# Patient Record
Sex: Female | Born: 1990 | Race: Black or African American | Hispanic: No | Marital: Single | Smoking: Current some day smoker
Health system: Southern US, Community
[De-identification: ages and names within clinical notes are randomized; demographics above are authoritative.]

## PROBLEM LIST (undated history)

## (undated) DIAGNOSIS — E119 Type 2 diabetes mellitus without complications: Secondary | ICD-10-CM

## (undated) DIAGNOSIS — Z89619 Acquired absence of unspecified leg above knee: Secondary | ICD-10-CM

## (undated) DIAGNOSIS — I10 Essential (primary) hypertension: Secondary | ICD-10-CM

## (undated) DIAGNOSIS — Z8619 Personal history of other infectious and parasitic diseases: Secondary | ICD-10-CM

## (undated) HISTORY — DX: Type 2 diabetes mellitus without complications: E11.9

## (undated) HISTORY — DX: Personal history of other infectious and parasitic diseases: Z86.19

## (undated) HISTORY — DX: Acquired absence of unspecified leg above knee: Z89.619

## (undated) HISTORY — PX: HIP FUSION: SHX669

---
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
## 2008-01-18 DIAGNOSIS — E66813 Obesity, class 3: Secondary | ICD-10-CM | POA: Insufficient documentation

## 2008-04-26 ENCOUNTER — Emergency Department: Payer: Self-pay | Admitting: Internal Medicine

## 2011-03-05 ENCOUNTER — Emergency Department: Payer: Self-pay | Admitting: Unknown Physician Specialty

## 2011-07-08 DIAGNOSIS — Z0000 Encounter for general adult medical examination without abnormal findings: Secondary | ICD-10-CM | POA: Diagnosis not present

## 2011-11-14 DIAGNOSIS — J069 Acute upper respiratory infection, unspecified: Secondary | ICD-10-CM | POA: Diagnosis not present

## 2012-10-19 DIAGNOSIS — S78119A Complete traumatic amputation at level between unspecified hip and knee, initial encounter: Secondary | ICD-10-CM | POA: Diagnosis not present

## 2012-10-19 DIAGNOSIS — R05 Cough: Secondary | ICD-10-CM | POA: Diagnosis not present

## 2012-11-29 DIAGNOSIS — Z029 Encounter for administrative examinations, unspecified: Secondary | ICD-10-CM | POA: Diagnosis not present

## 2012-11-29 DIAGNOSIS — E119 Type 2 diabetes mellitus without complications: Secondary | ICD-10-CM | POA: Diagnosis not present

## 2012-12-17 ENCOUNTER — Ambulatory Visit: Payer: Self-pay | Admitting: Hematology and Oncology

## 2012-12-25 DIAGNOSIS — IMO0002 Reserved for concepts with insufficient information to code with codable children: Secondary | ICD-10-CM | POA: Diagnosis not present

## 2012-12-25 DIAGNOSIS — E119 Type 2 diabetes mellitus without complications: Secondary | ICD-10-CM | POA: Diagnosis not present

## 2013-08-04 DIAGNOSIS — R7309 Other abnormal glucose: Secondary | ICD-10-CM | POA: Diagnosis not present

## 2013-08-04 DIAGNOSIS — J209 Acute bronchitis, unspecified: Secondary | ICD-10-CM | POA: Diagnosis not present

## 2013-08-04 LAB — HEMOGLOBIN A1C: Hemoglobin A1C: 6.7

## 2013-09-12 DIAGNOSIS — J069 Acute upper respiratory infection, unspecified: Secondary | ICD-10-CM | POA: Diagnosis not present

## 2013-09-12 DIAGNOSIS — R7309 Other abnormal glucose: Secondary | ICD-10-CM | POA: Diagnosis not present

## 2013-09-23 ENCOUNTER — Emergency Department: Payer: Self-pay | Admitting: Emergency Medicine

## 2013-09-23 DIAGNOSIS — E119 Type 2 diabetes mellitus without complications: Secondary | ICD-10-CM | POA: Diagnosis not present

## 2013-09-23 DIAGNOSIS — M47817 Spondylosis without myelopathy or radiculopathy, lumbosacral region: Secondary | ICD-10-CM | POA: Diagnosis not present

## 2013-09-23 DIAGNOSIS — S339XXA Sprain of unspecified parts of lumbar spine and pelvis, initial encounter: Secondary | ICD-10-CM | POA: Diagnosis not present

## 2013-09-23 DIAGNOSIS — Z79899 Other long term (current) drug therapy: Secondary | ICD-10-CM | POA: Diagnosis not present

## 2014-04-01 ENCOUNTER — Emergency Department: Payer: Self-pay | Admitting: Emergency Medicine

## 2014-04-01 DIAGNOSIS — E119 Type 2 diabetes mellitus without complications: Secondary | ICD-10-CM | POA: Diagnosis not present

## 2014-04-01 DIAGNOSIS — S39012A Strain of muscle, fascia and tendon of lower back, initial encounter: Secondary | ICD-10-CM | POA: Diagnosis not present

## 2014-04-01 DIAGNOSIS — M545 Low back pain: Secondary | ICD-10-CM | POA: Diagnosis not present

## 2014-04-01 DIAGNOSIS — R319 Hematuria, unspecified: Secondary | ICD-10-CM | POA: Diagnosis not present

## 2014-04-01 DIAGNOSIS — Z720 Tobacco use: Secondary | ICD-10-CM | POA: Diagnosis not present

## 2014-04-01 LAB — CBC WITH DIFFERENTIAL/PLATELET
Basophil #: 0.1 10*3/uL (ref 0.0–0.1)
Basophil %: 0.7 %
EOS PCT: 1.6 %
Eosinophil #: 0.2 10*3/uL (ref 0.0–0.7)
HCT: 36.1 % (ref 35.0–47.0)
HGB: 11.2 g/dL — AB (ref 12.0–16.0)
LYMPHS PCT: 29.2 %
Lymphocyte #: 3 10*3/uL (ref 1.0–3.6)
MCH: 23 pg — ABNORMAL LOW (ref 26.0–34.0)
MCHC: 31 g/dL — ABNORMAL LOW (ref 32.0–36.0)
MCV: 74 fL — AB (ref 80–100)
Monocyte #: 0.7 x10 3/mm (ref 0.2–0.9)
Monocyte %: 6.8 %
Neutrophil #: 6.4 10*3/uL (ref 1.4–6.5)
Neutrophil %: 61.7 %
Platelet: 380 10*3/uL (ref 150–440)
RBC: 4.85 10*6/uL (ref 3.80–5.20)
RDW: 18.4 % — AB (ref 11.5–14.5)
WBC: 10.4 10*3/uL (ref 3.6–11.0)

## 2014-04-01 LAB — COMPREHENSIVE METABOLIC PANEL
ALT: 16 U/L
ANION GAP: 9 (ref 7–16)
Albumin: 3.6 g/dL (ref 3.4–5.0)
Alkaline Phosphatase: 80 U/L
BUN: 11 mg/dL (ref 7–18)
Bilirubin,Total: 0.4 mg/dL (ref 0.2–1.0)
CHLORIDE: 106 mmol/L (ref 98–107)
Calcium, Total: 8.4 mg/dL — ABNORMAL LOW (ref 8.5–10.1)
Co2: 23 mmol/L (ref 21–32)
Creatinine: 0.74 mg/dL (ref 0.60–1.30)
EGFR (African American): >60
EGFR (Non-African Amer.): >60
Glucose: 117 mg/dL — ABNORMAL HIGH (ref 65–99)
OSMOLALITY: 276 (ref 275–301)
POTASSIUM: 3.6 mmol/L (ref 3.5–5.1)
SGOT(AST): 16 U/L (ref 15–37)
Sodium: 138 mmol/L (ref 136–145)
TOTAL PROTEIN: 7.5 g/dL (ref 6.4–8.2)

## 2014-04-01 LAB — URINALYSIS, COMPLETE
Bilirubin,UR: NEGATIVE
Glucose,UR: NEGATIVE mg/dL (ref 0–75)
Ketone: NEGATIVE
Nitrite: NEGATIVE
PH: 6 (ref 4.5–8.0)
Protein: NEGATIVE
RBC,UR: 6 /HPF (ref 0–5)
SPECIFIC GRAVITY: 1.031 (ref 1.003–1.030)

## 2014-04-01 LAB — PREGNANCY, URINE: PREGNANCY TEST, URINE: NEGATIVE m[IU]/mL

## 2015-06-04 DIAGNOSIS — E119 Type 2 diabetes mellitus without complications: Secondary | ICD-10-CM | POA: Insufficient documentation

## 2015-06-05 ENCOUNTER — Encounter: Payer: Self-pay | Admitting: Family Medicine

## 2015-06-11 ENCOUNTER — Ambulatory Visit (INDEPENDENT_AMBULATORY_CARE_PROVIDER_SITE_OTHER): Payer: Medicare Other | Admitting: Family Medicine

## 2015-06-11 ENCOUNTER — Encounter: Payer: Self-pay | Admitting: Family Medicine

## 2015-06-11 VITALS — BP 150/100 | HR 95 | Temp 98.7°F | Resp 16 | Wt 296.0 lb

## 2015-06-11 DIAGNOSIS — J0110 Acute frontal sinusitis, unspecified: Secondary | ICD-10-CM

## 2015-06-11 DIAGNOSIS — R509 Fever, unspecified: Secondary | ICD-10-CM

## 2015-06-11 DIAGNOSIS — G4700 Insomnia, unspecified: Secondary | ICD-10-CM | POA: Diagnosis not present

## 2015-06-11 DIAGNOSIS — Z89612 Acquired absence of left leg above knee: Secondary | ICD-10-CM | POA: Diagnosis not present

## 2015-06-11 LAB — POCT INFLUENZA A/B
Influenza A, POC: NEGATIVE
Influenza B, POC: NEGATIVE

## 2015-06-11 MED ORDER — AMOXICILLIN 500 MG PO CAPS: 1000.0000 mg | ORAL_CAPSULE | Freq: Two times a day (BID) | ORAL | Status: AC

## 2015-06-11 MED ORDER — FLUTICASONE PROPIONATE 50 MCG/ACT NA SUSP: 2.0000 | Freq: Every day | NASAL | Status: DC

## 2015-06-11 NOTE — Patient Instructions (Signed)
Try OTC melatonin  at bedtime every night to help sleep

## 2015-06-11 NOTE — Progress Notes (Signed)
       Patient: Casey Reese Female    DOB: [birth year]   24 y.o.   MRN: [id number] Visit Date: 06/11/2015  Today's Provider: Mila Merry, MD   Chief Complaint  Patient presents with  . Sinusitis   Subjective:    Sinusitis This is a new problem. The current episode started yesterday. The problem has been gradually worsening since onset. The maximum temperature recorded prior to her arrival was 101 - 101.9 F. The fever has been present for 1 to 2 days. Associated symptoms include chills, congestion, coughing, headaches, neck pain, shortness of breath, sinus pressure, sneezing, a sore throat and swollen glands. Pertinent negatives include no diaphoresis, ear pain or hoarse voice. Past treatments include nothing.   Headache and sinus burning started yesterday morning. Rapid onset fever last night 102-100. She also complains of having difficulty sleeping at night. Has tried Unisom which made her dizzy.   Allergies  Allergen Reactions  . Sulfa Antibiotics    Previous Medications   ALBUTEROL (PROVENTIL HFA) 108 (90 BASE) MCG/ACT INHALER    Inhale 2 puffs into the lungs every 6 (six) hours as needed.    Review of Systems  Constitutional: Positive for chills. Negative for fever, diaphoresis, appetite change and fatigue.  HENT: Positive for congestion, sinus pressure, sneezing and sore throat. Negative for ear pain and hoarse voice.   Respiratory: Positive for cough and shortness of breath. Negative for chest tightness.   Cardiovascular: Negative for chest pain and palpitations.  Gastrointestinal: Negative for nausea, vomiting and abdominal pain.  Musculoskeletal: Positive for neck pain.  Neurological: Positive for headaches. Negative for dizziness and weakness.    Social History  Substance Use Topics  . Smoking status: Current Every Day Smoker    Types: Cigarettes  . Smokeless tobacco: Not on file  . Alcohol Use: No   Objective:   BP 150/100 mmHg  Pulse 95  Temp(Src) 98.7  F (37.1 C) (Oral)  Resp 16  Wt 296 lb (134.265 kg)  SpO2 99%  LMP 05/18/2015  Physical Exam  General Appearance:    Alert, cooperative, no distress  HENT:   bilateral TM normal without fluid or infection, neck without nodes, Frontal sinuses tender and nasal mucosa congested  Eyes:    PERRL, conjunctiva/corneas clear, EOM's intact       Lungs:     Clear to auscultation bilaterally, respirations unlabored  Heart:    Regular rate and rhythm  MS:    S/p left AKA      Results for orders placed or performed in visit on 06/11/15  POCT Influenza A/B  Result Value Ref Range   Influenza A, POC Negative Negative   Influenza B, POC Negative Negative       Assessment & Plan:     1. Acute frontal sinusitis, recurrence not specified Amoxicillin  BID for 10 days Call if symptoms change or if not rapidly improving.    2. Fever, unspecified fever cause  - POCT Influenza A/B  3. Insomnia Patient Instructions  Try OTC melatonin  at bedtime every night to help sleep          Mila Merry, MD  [facility]

## 2015-06-13 ENCOUNTER — Emergency Department
Admission: EM | Admit: 2015-06-13 | Discharge: 2015-06-13 | Disposition: A | Discharge status: Home / Self Care | Payer: Medicare Other | Attending: Emergency Medicine | Admitting: Emergency Medicine

## 2015-06-13 ENCOUNTER — Encounter: Payer: Self-pay | Admitting: Emergency Medicine

## 2015-06-13 DIAGNOSIS — Z7951 Long term (current) use of inhaled steroids: Secondary | ICD-10-CM | POA: Diagnosis not present

## 2015-06-13 DIAGNOSIS — R319 Hematuria, unspecified: Secondary | ICD-10-CM

## 2015-06-13 DIAGNOSIS — J069 Acute upper respiratory infection, unspecified: Secondary | ICD-10-CM | POA: Insufficient documentation

## 2015-06-13 DIAGNOSIS — Z3202 Encounter for pregnancy test, result negative: Secondary | ICD-10-CM | POA: Insufficient documentation

## 2015-06-13 DIAGNOSIS — E119 Type 2 diabetes mellitus without complications: Secondary | ICD-10-CM | POA: Insufficient documentation

## 2015-06-13 DIAGNOSIS — F17210 Nicotine dependence, cigarettes, uncomplicated: Secondary | ICD-10-CM | POA: Insufficient documentation

## 2015-06-13 DIAGNOSIS — N390 Urinary tract infection, site not specified: Secondary | ICD-10-CM | POA: Insufficient documentation

## 2015-06-13 DIAGNOSIS — R300 Dysuria: Secondary | ICD-10-CM | POA: Diagnosis present

## 2015-06-13 DIAGNOSIS — Z792 Long term (current) use of antibiotics: Secondary | ICD-10-CM | POA: Diagnosis not present

## 2015-06-13 LAB — URINALYSIS COMPLETE WITH MICROSCOPIC (ARMC ONLY)
BILIRUBIN URINE: NEGATIVE
Bacteria, UA: NONE SEEN
Glucose, UA: NEGATIVE mg/dL
KETONES UR: NEGATIVE mg/dL
Nitrite: NEGATIVE
PROTEIN: NEGATIVE mg/dL
SPECIFIC GRAVITY, URINE: 1.021 (ref 1.005–1.030)
pH: 5 (ref 5.0–8.0)

## 2015-06-13 LAB — POCT PREGNANCY, URINE: Preg Test, Ur: NEGATIVE

## 2015-06-13 MED ORDER — CIPROFLOXACIN HCL 500 MG PO TABS: 500.0000 mg | ORAL_TABLET | Freq: Two times a day (BID) | ORAL | Status: AC

## 2015-06-13 NOTE — ED Provider Notes (Signed)
[facility] Emergency Department Provider Note  ____________________________________________  Time seen: Approximately 12:26 PM  I have reviewed the triage vital signs and the nursing notes.   HISTORY  Chief Complaint Dysuria    HPI Casey Reese is a 25 y.o. female , NAD, presents to the emergency department with acute onset burning with urination this morning. Denies abdominal or back pain. Has not seen any blood in her urine. No fevers, chills, body aches. Also notes she has had upper respiratory symptoms including nonproductive cough, sinus congestion, nasal drainage over the last couple days.   Past Medical History  Diagnosis Date  . S/P above knee amputation (HCC)   . History of chicken pox   . Diabetes mellitus without complication (HCC)   . 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 Diabetes mellitus (HCC) 06/04/2015  . Morbid obesity (HCC) 01/18/2008  . 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 Above knee leg amputation Left 2003    Current Outpatient Rx  Name  Route  Sig  Dispense  Refill  . albuterol (PROVENTIL HFA) 108 (90 Base) MCG/ACT inhaler   Inhalation   Inhale 2 puffs into the lungs every 6 (six) hours as needed.         Marland Kitchen amoxicillin (AMOXIL) 500 MG capsule   Oral   Take 2 capsules (1,000 mg total) by mouth 2 (two) times daily.   40 capsule   0   . ciprofloxacin (CIPRO) 500 MG tablet   Oral   Take 1 tablet (500 mg total) by mouth 2 (two) times daily.   14 tablet   0   . fluticasone (FLONASE) 50 MCG/ACT nasal spray   Each Nare   Place 2 sprays into both nostrils daily.   16 g   1     Allergies Sulfa antibiotics  Family History  Problem Relation Age of Onset  . Diabetes Mother     type 2    Social History Social History  Substance Use Topics  . Smoking status: Current  Every Day Smoker    Types: Cigarettes  . Smokeless tobacco: None  . Alcohol Use: No     Review of Systems  Constitutional: No fever/chills, fatigue. Eyes: No visual changes. No discharge ENT: Positive nasal/sinus congestion, runny nose. No sore throat, ear pain. Cardiovascular: No chest pain. Respiratory: Positive non productive cough. No shortness of breath. No wheezing.  Gastrointestinal: No abdominal pain.  No nausea, vomiting.  Genitourinary: Positive for dysuria. No hematuria. No urinary hesitancy, urgency or increased frequency. Musculoskeletal: Negative for back pain.  Skin: Negative for rash, skin sores. Neurological: Negative for headaches, focal weakness or numbness. 10-point ROS otherwise negative.  ____________________________________________   PHYSICAL EXAM:  VITAL SIGNS: ED Triage Vitals  Enc Vitals Group     BP 06/13/15 1149 181/102 mmHg     Pulse Rate 06/13/15 1149 99     Resp --      Temp 06/13/15 1149 98.5 F (36.9 C)     Temp Source 06/13/15 1149 Oral     SpO2 06/13/15 1149 99 %     Weight 06/13/15 1149 257 lb (116.574 kg)     Height 06/13/15 1149  (1.676 m)     Head Cir --      Peak Flow --      Pain Score --  Pain Loc --      Pain Edu? --      Excl. in GC? --     Constitutional: Alert and oriented. Well appearing and in no acute distress. Eyes: Conjunctivae are normal. PERRL. EOMI without pain.  Head: Atraumatic. ENT:      Ears: Bilateral TMs visualized with trace serous effusions but no erythema or bulging. Light reflex is normal. Bilateral external ear canals without swelling, erythema, drainage.      Nose: Moderate congestion with clear rhinnorhea.      Mouth/Throat: Mucous membranes are moist. Pharynx without erythema, swelling, exudate. Neck: Supple with full range of motion Hematological/Lymphatic/Immunilogical: No cervical lymphadenopathy. Cardiovascular: Normal rate, regular rhythm. Normal S1 and S2.   Respiratory: Normal  respiratory effort without tachypnea or retractions. Lungs CTAB. Gastrointestinal: Soft and nontender. No distention. No CVA tenderness. Neurologic:  Normal speech and language. No gross focal neurologic deficits are appreciated.  Skin:  Skin is warm, dry and intact. No rash noted. Psychiatric: Mood and affect are normal. Speech and behavior are normal. Patient exhibits appropriate insight and judgement.   ____________________________________________   LABS (all labs ordered are listed, but only abnormal results are displayed)  Labs Reviewed  URINALYSIS COMPLETEWITH MICROSCOPIC ([facility] ONLY) - Abnormal; Notable for the following:    Color, Urine YELLOW (*)    APPearance HAZY (*)    Hgb urine dipstick 2+ (*)    Leukocytes, UA 1+ (*)    Squamous Epithelial / LPF 6-30 (*)    All other components within normal limits  URINE CULTURE  POC URINE PREG, ED  POCT PREGNANCY, URINE   ____________________________________________  EKG  None ____________________________________________  RADIOLOGY  None ____________________________________________    PROCEDURES  Procedure(s) performed: None   Medications - No data to display   ____________________________________________   INITIAL IMPRESSION / ASSESSMENT AND PLAN / ED COURSE  Pertinent lab results that were available during my care of the patient were reviewed by me and considered in my medical decision making (see chart for details).  Patient's diagnosis is consistent with urinary tract infection and viral upper respiratory infection. Patient will be discharged home with prescriptions for ciprofloxacin 500 mg take one tablet by mouth twice daily for 10 days.  Patient is able to take over-the-counter cough and cold medications as needed for upper respiratory symptoms. Patient is to follow up with her primary care provider if symptoms persist past this treatment course. Patient is given ED precautions to return to the ED for any  worsening or new symptoms.    ____________________________________________  FINAL CLINICAL IMPRESSION(S) / ED DIAGNOSES  Final diagnoses:  Urinary tract infection with hematuria, site unspecified  Upper respiratory infection      NEW MEDICATIONS STARTED DURING THIS VISIT:  Discharge Medication List as of 06/13/2015 12:52 PM    START taking these medications   Details  ciprofloxacin (CIPRO) 500 MG tablet Take 1 tablet (500 mg total) by mouth 2 (two) times daily., Starting 06/13/2015, Until Wed 06/20/15, Print             Ernestene Kiel Taylor, PA-C 06/13/15 1336  Rockne Menghini, MD 06/13/15 1541

## 2015-06-13 NOTE — Discharge Instructions (Signed)
Urinary Tract Infection °Urinary tract infections (UTIs) can develop anywhere along your urinary tract. Your urinary tract is your body's drainage system for removing wastes and extra water. Your urinary tract includes two kidneys, two ureters, a bladder, and a urethra. Your kidneys are a pair of bean-shaped organs. Each kidney is about the size of your fist. They are located below your ribs, one on each side of your spine. °CAUSES °Infections are caused by microbes, which are microscopic organisms, including fungi, viruses, and bacteria. These organisms are so small that they can only be seen through a microscope. Bacteria are the microbes that most commonly cause UTIs. °SYMPTOMS  °Symptoms of UTIs may vary by age and gender of the patient and by the location of the infection. Symptoms in young women typically include a frequent and intense urge to urinate and a painful, burning feeling in the bladder or urethra during urination. Older women and men are more likely to be tired, shaky, and weak and have muscle aches and abdominal pain. A fever may mean the infection is in your kidneys. Other symptoms of a kidney infection include pain in your back or sides below the ribs, nausea, and vomiting. °DIAGNOSIS °To diagnose a UTI, your caregiver will ask you about your symptoms. Your caregiver will also ask you to provide a urine sample. The urine sample will be tested for bacteria and white blood cells. White blood cells are made by your body to help fight infection. °TREATMENT  °Typically, UTIs can be treated with medication. Because most UTIs are caused by a bacterial infection, they usually can be treated with the use of antibiotics. The choice of antibiotic and length of treatment depend on your symptoms and the type of bacteria causing your infection. °HOME CARE INSTRUCTIONS °· If you were prescribed antibiotics, take them exactly as your caregiver instructs you. Finish the medication even if you feel better after  you have only taken some of the medication. °· Drink enough water and fluids to keep your urine clear or pale yellow. °· Avoid caffeine, tea, and carbonated beverages. They tend to irritate your bladder. °· Empty your bladder often. Avoid holding urine for long periods of time. °· Empty your bladder before and after sexual intercourse. °· After a bowel movement, women should cleanse from front to back. Use each tissue only once. °SEEK MEDICAL CARE IF:  °· You have back pain. °· You develop a fever. °· Your symptoms do not begin to resolve within 3 days. °SEEK IMMEDIATE MEDICAL CARE IF:  °· You have severe back pain or lower abdominal pain. °· You develop chills. °· You have nausea or vomiting. °· You have continued burning or discomfort with urination. °MAKE SURE YOU:  °· Understand these instructions. °· Will watch your condition. °· Will get help right away if you are not doing well or get worse. °  °This information is not intended to replace advice given to you by your health care provider. Make sure you discuss any questions you have with your health care provider. °  °Document Released: 01/22/2005 Document Revised: 01/03/2015 Document Reviewed: 05/23/2011 °Elsevier Interactive Patient Education ©2016 [employer] °Upper Respiratory Infection, Adult °Most upper respiratory infections (URIs) are a viral infection of the air passages leading to the lungs. A URI affects the nose, throat, and upper air passages. The most common type of URI is nasopharyngitis and is typically referred to as "the common cold." °URIs run their course and usually go away on their own. Most of   the time, a URI does not require medical attention, but sometimes a bacterial infection in the upper airways can follow a viral infection. This is called a secondary infection. Sinus and middle ear infections are common types of secondary upper respiratory infections. °Bacterial pneumonia can also complicate a URI. A URI can worsen asthma and  chronic obstructive pulmonary disease (COPD). Sometimes, these complications can require emergency medical care and may be life threatening.  °CAUSES °Almost all URIs are caused by viruses. A virus is a type of germ and can spread from one person to another.  °RISKS FACTORS °You may be at risk for a URI if:  °· You smoke.   °· You have chronic heart or lung disease. °· You have a weakened defense (immune) system.   °· You are very young or very old.   °· You have nasal allergies or asthma. °· You work in crowded or poorly ventilated areas. °· You work in health care facilities or schools. °SIGNS AND SYMPTOMS  °Symptoms typically develop 2-3 days after you come in contact with a cold virus. Most viral URIs last 7-10 days. However, viral URIs from the influenza virus (flu virus) can last 14-18 days and are typically more severe. Symptoms may include:  °· Runny or stuffy (congested) nose.   °· Sneezing.   °· Cough.   °· Sore throat.   °· Headache.   °· Fatigue.   °· Fever.   °· Loss of appetite.   °· Pain in your forehead, behind your eyes, and over your cheekbones (sinus pain). °· Muscle aches.   °DIAGNOSIS  °Your health care provider may diagnose a URI by: °· Physical exam. °· Tests to check that your symptoms are not due to another condition such as: °· Strep throat. °· Sinusitis. °· Pneumonia. °· Asthma. °TREATMENT  °A URI goes away on its own with time. It cannot be cured with medicines, but medicines may be prescribed or recommended to relieve symptoms. Medicines may help: °· Reduce your fever. °· Reduce your cough. °· Relieve nasal congestion. °HOME CARE INSTRUCTIONS  °· Take medicines only as directed by your health care provider.   °· Gargle warm saltwater or take cough drops to comfort your throat as directed by your health care provider. °· Use a warm mist humidifier or inhale steam from a shower to increase air moisture. This may make it easier to breathe. °· Drink enough fluid to keep your urine clear or  pale yellow.   °· Eat soups and other clear broths and maintain good nutrition.   °· Rest as needed.   °· Return to work when your temperature has returned to normal or as your health care provider advises. You may need to stay home longer to avoid infecting others. You can also use a face mask and careful hand washing to prevent spread of the virus. °· Increase the usage of your inhaler if you have asthma.   °· Do not use any tobacco products, including cigarettes, chewing tobacco, or electronic cigarettes. If you need help quitting, ask your health care provider. °PREVENTION  °The best way to protect yourself from getting a cold is to practice good hygiene.  °· Avoid oral or hand contact with people with cold symptoms.   °· Wash your hands often if contact occurs.   °There is no clear evidence that vitamin C, vitamin E, echinacea, or exercise reduces the chance of developing a cold. However, it is always recommended to get plenty of rest, exercise, and practice good nutrition.  °SEEK MEDICAL CARE IF:  °· You are getting worse rather than better.   °·   Your symptoms are not controlled by medicine.   °· You have chills. °· You have worsening shortness of breath. °· You have brown or red mucus. °· You have yellow or brown nasal discharge. °· You have pain in your face, especially when you bend forward. °· You have a fever. °· You have swollen neck glands. °· You have pain while swallowing. °· You have white areas in the back of your throat. °SEEK IMMEDIATE MEDICAL CARE IF:  °· You have severe or persistent: °¨ Headache. °¨ Ear pain. °¨ Sinus pain. °¨ Chest pain. °· You have chronic lung disease and any of the following: °¨ Wheezing. °¨ Prolonged cough. °¨ Coughing up blood. °¨ A change in your usual mucus. °· You have a stiff neck. °· You have changes in your: °¨ Vision. °¨ Hearing. °¨ Thinking. °¨ Mood. °MAKE SURE YOU:  °· Understand these instructions. °· Will watch your condition. °· Will get help right away if you  are not doing well or get worse. °  °This information is not intended to replace advice given to you by your health care provider. Make sure you discuss any questions you have with your health care provider. °  °Document Released: 10/08/2000 Document Revised: 08/29/2014 Document Reviewed: 07/20/2013 °Elsevier Interactive Patient Education ©2016 Elsevier Inc. ° °

## 2015-06-13 NOTE — ED Notes (Signed)
A/o, vss, HTN. Lungs clear. Non-productive cough. Sinus congestion. No c/o pain.

## 2015-06-13 NOTE — ED Notes (Signed)
Pt aware of need for urine sample. Has container.

## 2015-06-13 NOTE — ED Notes (Signed)
Pt with burning when urinating. Also with upper respiratory symptoms.

## 2015-06-14 LAB — URINE CULTURE

## 2015-06-26 ENCOUNTER — Encounter: Payer: Self-pay | Admitting: Medical Oncology

## 2015-06-26 ENCOUNTER — Emergency Department
Admission: EM | Admit: 2015-06-26 | Discharge: 2015-06-26 | Disposition: A | Discharge status: Home / Self Care | Payer: Medicare Other | Attending: Emergency Medicine | Admitting: Emergency Medicine

## 2015-06-26 DIAGNOSIS — Z79899 Other long term (current) drug therapy: Secondary | ICD-10-CM | POA: Diagnosis not present

## 2015-06-26 DIAGNOSIS — Z89612 Acquired absence of left leg above knee: Secondary | ICD-10-CM | POA: Insufficient documentation

## 2015-06-26 DIAGNOSIS — R1010 Upper abdominal pain, unspecified: Secondary | ICD-10-CM | POA: Diagnosis present

## 2015-06-26 DIAGNOSIS — Z3202 Encounter for pregnancy test, result negative: Secondary | ICD-10-CM | POA: Diagnosis not present

## 2015-06-26 DIAGNOSIS — E119 Type 2 diabetes mellitus without complications: Secondary | ICD-10-CM | POA: Insufficient documentation

## 2015-06-26 DIAGNOSIS — K2970 Gastritis, unspecified, without bleeding: Secondary | ICD-10-CM | POA: Insufficient documentation

## 2015-06-26 DIAGNOSIS — F17210 Nicotine dependence, cigarettes, uncomplicated: Secondary | ICD-10-CM | POA: Insufficient documentation

## 2015-06-26 LAB — COMPREHENSIVE METABOLIC PANEL
ALBUMIN: 4.4 g/dL (ref 3.5–5.0)
ALT: 20 U/L (ref 14–54)
AST: 23 U/L (ref 15–41)
Alkaline Phosphatase: 103 U/L (ref 38–126)
Anion gap: 12 (ref 5–15)
BILIRUBIN TOTAL: 0.8 mg/dL (ref 0.3–1.2)
BUN: 8 mg/dL (ref 6–20)
CHLORIDE: 100 mmol/L — AB (ref 101–111)
CO2: 25 mmol/L (ref 22–32)
CREATININE: 0.69 mg/dL (ref 0.44–1.00)
Calcium: 9.8 mg/dL (ref 8.9–10.3)
GFR calc Af Amer: >60 mL/min (ref >60)
GLUCOSE: 144 mg/dL — AB (ref 65–99)
POTASSIUM: 3.8 mmol/L (ref 3.5–5.1)
Sodium: 137 mmol/L (ref 135–145)
TOTAL PROTEIN: 8.6 g/dL — AB (ref 6.5–8.1)

## 2015-06-26 LAB — POCT PREGNANCY, URINE: PREG TEST UR: NEGATIVE

## 2015-06-26 LAB — LIPASE, BLOOD: Lipase: 20 U/L (ref 11–51)

## 2015-06-26 LAB — URINALYSIS COMPLETE WITH MICROSCOPIC (ARMC ONLY)
BILIRUBIN URINE: NEGATIVE
GLUCOSE, UA: NEGATIVE mg/dL
Hgb urine dipstick: NEGATIVE
Ketones, ur: NEGATIVE mg/dL
Nitrite: NEGATIVE
Protein, ur: NEGATIVE mg/dL
Specific Gravity, Urine: 1.017 (ref 1.005–1.030)
pH: 7 (ref 5.0–8.0)

## 2015-06-26 LAB — CBC
HEMATOCRIT: 40.3 % (ref 35.0–47.0)
Hemoglobin: 13.1 g/dL (ref 12.0–16.0)
MCH: 25.7 pg — ABNORMAL LOW (ref 26.0–34.0)
MCHC: 32.5 g/dL (ref 32.0–36.0)
MCV: 79 fL — AB (ref 80.0–100.0)
PLATELETS: 387 10*3/uL (ref 150–440)
RBC: 5.11 MIL/uL (ref 3.80–5.20)
RDW: 15.5 % — AB (ref 11.5–14.5)
WBC: 13.6 10*3/uL — AB (ref 3.6–11.0)

## 2015-06-26 MED ORDER — SUCRALFATE 1 G PO TABS: 1.0000 g | ORAL_TABLET | Freq: Four times a day (QID) | ORAL | Status: DC

## 2015-06-26 MED ORDER — GI COCKTAIL ~~LOC~~
30.0000 mL | Freq: Once | ORAL | Status: AC
  Administered 2015-06-26: 30 mL via ORAL
  Filled 2015-06-26: qty 30

## 2015-06-26 NOTE — ED Notes (Addendum)
Pt reports she has been having some lower abd pain/cramping since Sunday. Pain feels like menstrual cramps. Pt also reports a few times she has vomited. Recently dx with UTI and did not complete treatment. Pt hypertensive in triage.

## 2015-06-26 NOTE — Discharge Instructions (Signed)

## 2015-06-26 NOTE — ED Provider Notes (Signed)
[facility] Emergency Department Provider Note  ____________________________________________    I have reviewed the triage vital signs and the nursing notes.   HISTORY  Chief Complaint Abdominal Cramping   HPI Casey Reese is a 25 y.o. female who presents with points of abdominal cramping. This is primarily in the upper abdomen as opposed to what is reported in the nursing note. Patient reports this is been occurring for the last 2 days. She reports it is intermittent and sometimes moderate to severe. She denies right upper quadrant pain. No nausea or vomiting. No fevers or chills. Normal stools.     Past Medical History  Diagnosis Date  . S/P above knee amputation (HCC)   . History of chicken pox   . Diabetes mellitus without complication (HCC)   . 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 Diabetes mellitus (HCC) 06/04/2015  . Morbid obesity (HCC) 01/18/2008  . 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 Above knee leg amputation Left 2003    Current Outpatient Rx  Name  Route  Sig  Dispense  Refill  . albuterol (PROVENTIL HFA) 108 (90 Base) MCG/ACT inhaler   Inhalation   Inhale 2 puffs into the lungs every 6 (six) hours as needed.         . fluticasone (FLONASE) 50 MCG/ACT nasal spray   Each Nare   Place 2 sprays into both nostrils daily.   16 g   1   . sucralfate (CARAFATE) 1 g tablet   Oral   Take 1 tablet (1 g total) by mouth 4 (four) times daily.   30 tablet   1     Allergies Sulfa antibiotics  Family History  Problem Relation Age of Onset  . Diabetes Mother     type 2    Social History Social History  Substance Use Topics  . Smoking status: Current Every Day Smoker    Types: Cigarettes  . Smokeless tobacco: None  . Alcohol Use: No    Review of Systems  Constitutional: Negative  for fever. Eyes: Negative for visual changes. ENT: Negative for sore throat Cardiovascular: Negative for chest pain. Respiratory: Negative for cough Gastrointestinal: As above Genitourinary: Negative for dysuria. Musculoskeletal: Negative for back pain. Skin: Negative for rash. Neurological: Negative for headaches Psychiatric: No anxiety    ____________________________________________   PHYSICAL EXAM:  VITAL SIGNS: ED Triage Vitals  Enc Vitals Group     BP 06/26/15 1612 208/130 mmHg     Pulse Rate 06/26/15 1611 90     Resp 06/26/15 1611 18     Temp 06/26/15 1611 98.6 F (37 C)     Temp Source 06/26/15 1611 Oral     SpO2 06/26/15 1611 98 %     Weight 06/26/15 1611 273 lb (123.832 kg)     Height 06/26/15 1611  (1.676 m)     Head Cir --      Peak Flow --      Pain Score 06/26/15 1611 7     Pain Loc --      Pain Edu? --      Excl. in GC? --      Constitutional: Alert and oriented. Well appearing and in no distress. Eyes: Conjunctivae are normal.  ENT   Head: Normocephalic and atraumatic.   Mouth/Throat: Mucous membranes are moist. Cardiovascular:  Normal rate, regular rhythm. Normal and symmetric distal pulses are present in all extremities. Respiratory: Normal respiratory effort without tachypnea nor retractions.  Gastrointestinal: Soft and non-tender in all quadrants. No distention.  Genitourinary: deferred Musculoskeletal: Left AKA Neurologic:  Normal speech and language. No gross focal neurologic deficits are appreciated. Skin:  Skin is warm, dry and intact. No rash noted. Psychiatric: Mood and affect are normal. Patient exhibits appropriate insight and judgment.  ____________________________________________    LABS (pertinent positives/negatives)  Labs Reviewed  COMPREHENSIVE METABOLIC PANEL - Abnormal; Notable for the following:    Chloride 100 (*)    Glucose, Bld 144 (*)    Total Protein 8.6 (*)    All other components within normal limits   CBC - Abnormal; Notable for the following:    WBC 13.6 (*)    MCV 79.0 (*)    MCH 25.7 (*)    RDW 15.5 (*)    All other components within normal limits  URINALYSIS COMPLETEWITH MICROSCOPIC ([facility] ONLY) - Abnormal; Notable for the following:    Color, Urine YELLOW (*)    APPearance CLEAR (*)    Leukocytes, UA TRACE (*)    Bacteria, UA RARE (*)    Squamous Epithelial / LPF 0-5 (*)    All other components within normal limits  LIPASE, BLOOD  POC URINE PREG, ED  POCT PREGNANCY, URINE    ____________________________________________   EKG  None  ____________________________________________    RADIOLOGY I have personally reviewed any xrays that were ordered on this patient: None  ____________________________________________   PROCEDURES  Procedure(s) performed: none  Critical Care performed: none  ____________________________________________   INITIAL IMPRESSION / ASSESSMENT AND PLAN / ED COURSE  Pertinent labs & imaging results that were available during my care of the patient were reviewed by me and considered in my medical decision making (see chart for details).  Patient overall well-appearing with benign exam. Lab work is overall unremarkable although the patient has mild elevation in white blood cell count. Urinalysis is unremarkable. Patient did have improvement after GI cocktail which made me suspicious this is an episode of gastritis. We will treat conservatively and have the patient follow with her PCP. She knows to return if her pain worsens  ____________________________________________   FINAL CLINICAL IMPRESSION(S) / ED DIAGNOSES  Final diagnoses:  Gastritis     Jene Every, MD 06/26/15 2234

## 2015-06-26 NOTE — ED Notes (Signed)
Pt discharged to home.  Family member driving.  Discharge instructions reviewed.  Verbalized understanding.  No questions or concerns at this time.  Teach back verified.  Pt in NAD.  No items left in ED.   

## 2015-06-27 ENCOUNTER — Emergency Department
Admission: EM | Admit: 2015-06-27 | Discharge: 2015-06-27 | Disposition: A | Discharge status: Home / Self Care | Payer: Medicare Other | Attending: Emergency Medicine | Admitting: Emergency Medicine

## 2015-06-27 ENCOUNTER — Encounter: Payer: Self-pay | Admitting: Emergency Medicine

## 2015-06-27 ENCOUNTER — Emergency Department: Payer: Medicare Other

## 2015-06-27 DIAGNOSIS — E119 Type 2 diabetes mellitus without complications: Secondary | ICD-10-CM | POA: Insufficient documentation

## 2015-06-27 DIAGNOSIS — Z7951 Long term (current) use of inhaled steroids: Secondary | ICD-10-CM | POA: Diagnosis not present

## 2015-06-27 DIAGNOSIS — R195 Other fecal abnormalities: Secondary | ICD-10-CM | POA: Diagnosis not present

## 2015-06-27 DIAGNOSIS — R1084 Generalized abdominal pain: Secondary | ICD-10-CM | POA: Diagnosis not present

## 2015-06-27 DIAGNOSIS — R1013 Epigastric pain: Secondary | ICD-10-CM

## 2015-06-27 DIAGNOSIS — R1012 Left upper quadrant pain: Secondary | ICD-10-CM | POA: Diagnosis not present

## 2015-06-27 DIAGNOSIS — R112 Nausea with vomiting, unspecified: Secondary | ICD-10-CM | POA: Diagnosis not present

## 2015-06-27 DIAGNOSIS — Z79899 Other long term (current) drug therapy: Secondary | ICD-10-CM | POA: Insufficient documentation

## 2015-06-27 DIAGNOSIS — R109 Unspecified abdominal pain: Secondary | ICD-10-CM | POA: Diagnosis not present

## 2015-06-27 DIAGNOSIS — F17210 Nicotine dependence, cigarettes, uncomplicated: Secondary | ICD-10-CM | POA: Insufficient documentation

## 2015-06-27 LAB — CBC WITH DIFFERENTIAL/PLATELET
BASOS ABS: 0.1 10*3/uL (ref 0–0.1)
Basophils Relative: 1 %
EOS ABS: 0.1 10*3/uL (ref 0–0.7)
EOS PCT: 1 %
HCT: 39.9 % (ref 35.0–47.0)
Hemoglobin: 13.2 g/dL (ref 12.0–16.0)
LYMPHS PCT: 23 %
Lymphs Abs: 2.5 10*3/uL (ref 1.0–3.6)
MCH: 26 pg (ref 26.0–34.0)
MCHC: 33.1 g/dL (ref 32.0–36.0)
MCV: 78.6 fL — AB (ref 80.0–100.0)
MONO ABS: 0.8 10*3/uL (ref 0.2–0.9)
Monocytes Relative: 7 %
Neutro Abs: 7.5 10*3/uL — ABNORMAL HIGH (ref 1.4–6.5)
Neutrophils Relative %: 68 %
PLATELETS: 378 10*3/uL (ref 150–440)
RBC: 5.08 MIL/uL (ref 3.80–5.20)
RDW: 15 % — AB (ref 11.5–14.5)
WBC: 10.9 10*3/uL (ref 3.6–11.0)

## 2015-06-27 LAB — COMPREHENSIVE METABOLIC PANEL
ALT: 26 U/L (ref 14–54)
AST: 35 U/L (ref 15–41)
Albumin: 4.5 g/dL (ref 3.5–5.0)
Alkaline Phosphatase: 95 U/L (ref 38–126)
Anion gap: 9 (ref 5–15)
BUN: 9 mg/dL (ref 6–20)
CHLORIDE: 100 mmol/L — AB (ref 101–111)
CO2: 26 mmol/L (ref 22–32)
Calcium: 9.4 mg/dL (ref 8.9–10.3)
Creatinine, Ser: 0.65 mg/dL (ref 0.44–1.00)
Glucose, Bld: 155 mg/dL — ABNORMAL HIGH (ref 65–99)
POTASSIUM: 4 mmol/L (ref 3.5–5.1)
SODIUM: 135 mmol/L (ref 135–145)
Total Bilirubin: 1.1 mg/dL (ref 0.3–1.2)
Total Protein: 8.5 g/dL — ABNORMAL HIGH (ref 6.5–8.1)

## 2015-06-27 LAB — LIPASE, BLOOD: LIPASE: 19 U/L (ref 11–51)

## 2015-06-27 MED ORDER — FAMOTIDINE IN NACL 20-0.9 MG/50ML-% IV SOLN
INTRAVENOUS | Status: DC
Start: 2015-06-27 — End: 2015-06-27
  Filled 2015-06-27: qty 50

## 2015-06-27 MED ORDER — RANITIDINE HCL 150 MG PO TABS: 150.0000 mg | ORAL_TABLET | Freq: Two times a day (BID) | ORAL | Status: DC

## 2015-06-27 MED ORDER — IOHEXOL 240 MG/ML SOLN
25.0000 mL | Freq: Once | INTRAMUSCULAR | Status: AC | PRN
  Administered 2015-06-27: 25 mL via ORAL
  Filled 2015-06-27: qty 25

## 2015-06-27 MED ORDER — FAMOTIDINE IN NACL 20-0.9 MG/50ML-% IV SOLN
20.0000 mg | Freq: Once | INTRAVENOUS | Status: AC
  Administered 2015-06-27: 20 mg via INTRAVENOUS

## 2015-06-27 MED ORDER — IOHEXOL 300 MG/ML  SOLN
100.0000 mL | Freq: Once | INTRAMUSCULAR | Status: AC | PRN
  Administered 2015-06-27: 100 mL via INTRAVENOUS
  Filled 2015-06-27: qty 100

## 2015-06-27 MED ORDER — SODIUM CHLORIDE 0.9 % IV BOLUS (SEPSIS)
1000.0000 mL | Freq: Once | INTRAVENOUS | Status: AC
  Administered 2015-06-27: 1000 mL via INTRAVENOUS

## 2015-06-27 NOTE — ED Provider Notes (Signed)
[facility] Emergency Department Provider Note  ____________________________________________  Time seen: Approximately 345 PM  I have reviewed the triage vital signs and the nursing notes.   HISTORY  Chief Complaint Abdominal Cramping    HPI Casey Reese is a 25 y.o. female with a history of diabetes who is returning to the emergency department today for persistent left upper quadrant abdominal pain. She says her pain is been intermittent over the past 3 days. She has been taking sucralfate at home with minimal relief. She was in the emergency department yesterday and had a GI cocktail was diagnosed with gastritis. However, she says the pain is persistent. It is nonradiating. It feels like a cramp. The patient was able to tolerate a banana this morning without any vomiting but says that she does have some nausea. Says that she also has some episodes of dark green to black stools. She is concerned because she said she ate a [place] bone accidentally this past Sunday. She says that she did not see the bone but said that she felt something sharp go down when she swallowed.Denies any dysuria, vaginal bleeding or discharge.  Past Medical History  Diagnosis Date  . S/P above knee amputation (HCC)   . History of chicken pox   . Diabetes mellitus without complication (HCC)   . 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 Diabetes mellitus (HCC) 06/04/2015  . Morbid obesity (HCC) 01/18/2008  . 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 Above knee leg amputation Left 2003    Current Outpatient Rx  Name  Route  Sig  Dispense  Refill  . albuterol (PROVENTIL HFA) 108 (90 Base) MCG/ACT inhaler   Inhalation   Inhale 2 puffs into the lungs every 6 (six) hours as needed.         . fluticasone (FLONASE) 50 MCG/ACT nasal spray   Each  Nare   Place 2 sprays into both nostrils daily.   16 g   1   . sucralfate (CARAFATE) 1 g tablet   Oral   Take 1 tablet (1 g total) by mouth 4 (four) times daily.   30 tablet   1     Allergies Sulfa antibiotics  Family History  Problem Relation Age of Onset  . Diabetes Mother     type 2    Social History Social History  Substance Use Topics  . Smoking status: Current Every Day Smoker    Types: Cigarettes  . Smokeless tobacco: None  . Alcohol Use: 0.0 oz/week    0 Standard drinks or equivalent per week     Comment: occasional    Review of Systems Constitutional: No fever/chills Eyes: No visual changes. ENT: No sore throat. Cardiovascular: Denies chest pain. Respiratory: Denies shortness of breath. Gastrointestinal:  no vomiting.  No diarrhea.  No constipation. Genitourinary: Negative for dysuria. Musculoskeletal: Negative for back pain. Skin: Negative for rash. Neurological: Negative for headaches, focal weakness or numbness.  10-point ROS otherwise negative.  ____________________________________________   PHYSICAL EXAM:  VITAL SIGNS: ED Triage Vitals  Enc Vitals Group     BP 06/27/15 1457 149/85 mmHg     Pulse Rate 06/27/15 1457 87     Resp 06/27/15 1457 18     Temp 06/27/15 1457 98.7 F (37.1 C)     Temp Source 06/27/15 1457 Oral  SpO2 06/27/15 1457 99 %     Weight 06/27/15 1457 270 lb (122.471 kg)     Height 06/27/15 1457  (1.676 m)     Head Cir --      Peak Flow --      Pain Score 06/27/15 1520 7     Pain Loc --      Pain Edu? --      Excl. in GC? --     Constitutional: Alert and oriented. Well appearing and in no acute distress. Eyes: Conjunctivae are normal. PERRL. EOMI. Head: Atraumatic. Nose: No congestion/rhinnorhea. Mouth/Throat: Mucous membranes are moist.   Neck: No stridor.   Cardiovascular: Normal rate, regular rhythm. Grossly normal heart sounds.  Good peripheral circulation. Respiratory: Normal respiratory effort.   No retractions. Lungs CTAB. Gastrointestinal: Soft with mild to moderate left upper quadrant as well as epigastric tenderness palpation. No distention. No CVA tenderness. Digital rectal exam with green stool which is heme-negative. Musculoskeletal: No lower extremity tenderness nor edema.  No joint effusions. Left lower BKA. Neurologic:  Normal speech and language. No gross focal neurologic deficits are appreciated. No gait instability. Skin:  Skin is warm, dry and intact. No rash noted. Psychiatric: Mood and affect are normal. Speech and behavior are normal.  ____________________________________________   LABS (all labs ordered are listed, but only abnormal results are displayed)  Labs Reviewed  CBC WITH DIFFERENTIAL/PLATELET - Abnormal; Notable for the following:    MCV 78.6 (*)    RDW 15.0 (*)    Neutro Abs 7.5 (*)    All other components within normal limits  COMPREHENSIVE METABOLIC PANEL - Abnormal; Notable for the following:    Chloride 100 (*)    Glucose, Bld 155 (*)    Total Protein 8.5 (*)    All other components within normal limits  LIPASE, BLOOD   ____________________________________________  EKG   ____________________________________________  RADIOLOGY  IMPRESSION: 1. No acute findings to explain the patient's symptoms. 2. Hepatic steatosis. ____________________________________________   PROCEDURES    ____________________________________________   INITIAL IMPRESSION / ASSESSMENT AND PLAN / ED COURSE  Pertinent labs & imaging results that were available during my care of the patient were reviewed by me and considered in my medical decision making (see chart for details).  ----------------------------------------- 5:50 PM on 06/27/2015 -----------------------------------------  Patient resting comfortably at this time. Able to tolerate by mouth contrast. Says that her pain is reduced since being in the emergency department. This may be partially  related to the IV Pepcid that she received. Patient is denying any shortness of breath or chest pain. She isPERC negative and does not take any hormone supplements oral contraceptives. I will add Zantac to her medications.  No sign of any bone on her CAT scan or foreign body in the GI tract. No sign of perforation. Unclear if it was infected bone or cartilage that she fell on the back of her throat because the bone was never visualized. However, there do not appear to be any serious sequela after several days after the incident. ____________________________________________   FINAL CLINICAL IMPRESSION(S) / ED DIAGNOSES  Abdominal pain.    Myrna Blazer, MD 06/27/15 1754

## 2015-06-27 NOTE — ED Notes (Signed)
Pt states she swallowed a bone on Sunday and has had abdominal cramping and dark stools since then. Pt states she was seen here yesterday and told to return if the pain did not go away. She states is less than it has been (7/10) but that it was worse this morning and has been worse at night. Pt reports that walking and standing make the pain better.

## 2015-06-27 NOTE — Discharge Instructions (Signed)

## 2015-06-28 ENCOUNTER — Emergency Department: Payer: Medicare Other

## 2015-06-28 ENCOUNTER — Emergency Department
Admission: EM | Admit: 2015-06-28 | Discharge: 2015-06-28 | Disposition: A | Discharge status: Home / Self Care | Payer: Medicare Other | Attending: Emergency Medicine | Admitting: Emergency Medicine

## 2015-06-28 ENCOUNTER — Encounter: Payer: Self-pay | Admitting: Emergency Medicine

## 2015-06-28 ENCOUNTER — Encounter: Payer: Self-pay | Admitting: Family Medicine

## 2015-06-28 ENCOUNTER — Ambulatory Visit (INDEPENDENT_AMBULATORY_CARE_PROVIDER_SITE_OTHER): Payer: Medicare Other | Admitting: Family Medicine

## 2015-06-28 VITALS — BP 150/110 | HR 89 | Temp 98.0°F | Resp 16 | Wt 291.0 lb

## 2015-06-28 DIAGNOSIS — R112 Nausea with vomiting, unspecified: Secondary | ICD-10-CM | POA: Diagnosis not present

## 2015-06-28 DIAGNOSIS — R1012 Left upper quadrant pain: Secondary | ICD-10-CM | POA: Diagnosis not present

## 2015-06-28 DIAGNOSIS — R197 Diarrhea, unspecified: Secondary | ICD-10-CM | POA: Insufficient documentation

## 2015-06-28 DIAGNOSIS — R0989 Other specified symptoms and signs involving the circulatory and respiratory systems: Secondary | ICD-10-CM

## 2015-06-28 DIAGNOSIS — E119 Type 2 diabetes mellitus without complications: Secondary | ICD-10-CM | POA: Diagnosis not present

## 2015-06-28 DIAGNOSIS — F17210 Nicotine dependence, cigarettes, uncomplicated: Secondary | ICD-10-CM | POA: Insufficient documentation

## 2015-06-28 DIAGNOSIS — R1010 Upper abdominal pain, unspecified: Secondary | ICD-10-CM

## 2015-06-28 DIAGNOSIS — Z79899 Other long term (current) drug therapy: Secondary | ICD-10-CM | POA: Diagnosis not present

## 2015-06-28 DIAGNOSIS — I10 Essential (primary) hypertension: Secondary | ICD-10-CM | POA: Diagnosis not present

## 2015-06-28 DIAGNOSIS — R0789 Other chest pain: Secondary | ICD-10-CM | POA: Diagnosis not present

## 2015-06-28 DIAGNOSIS — R0602 Shortness of breath: Secondary | ICD-10-CM | POA: Diagnosis not present

## 2015-06-28 DIAGNOSIS — R079 Chest pain, unspecified: Secondary | ICD-10-CM | POA: Insufficient documentation

## 2015-06-28 DIAGNOSIS — R52 Pain, unspecified: Secondary | ICD-10-CM

## 2015-06-28 DIAGNOSIS — Z7951 Long term (current) use of inhaled steroids: Secondary | ICD-10-CM | POA: Insufficient documentation

## 2015-06-28 LAB — TROPONIN I: Troponin I: <0.03 ng/mL (ref <0.031)

## 2015-06-28 LAB — COMPREHENSIVE METABOLIC PANEL
ALT: 23 U/L (ref 14–54)
ANION GAP: 10 (ref 5–15)
AST: 31 U/L (ref 15–41)
Albumin: 4.1 g/dL (ref 3.5–5.0)
Alkaline Phosphatase: 88 U/L (ref 38–126)
BUN: 9 mg/dL (ref 6–20)
CHLORIDE: 101 mmol/L (ref 101–111)
CO2: 24 mmol/L (ref 22–32)
Calcium: 8.5 mg/dL — ABNORMAL LOW (ref 8.9–10.3)
Creatinine, Ser: 0.75 mg/dL (ref 0.44–1.00)
Glucose, Bld: 209 mg/dL — ABNORMAL HIGH (ref 65–99)
POTASSIUM: 3.2 mmol/L — AB (ref 3.5–5.1)
Sodium: 135 mmol/L (ref 135–145)
TOTAL PROTEIN: 7.9 g/dL (ref 6.5–8.1)
Total Bilirubin: 0.9 mg/dL (ref 0.3–1.2)

## 2015-06-28 LAB — CBC
HEMATOCRIT: 36.8 % (ref 35.0–47.0)
HEMOGLOBIN: 12.1 g/dL (ref 12.0–16.0)
MCH: 25.7 pg — ABNORMAL LOW (ref 26.0–34.0)
MCHC: 33 g/dL (ref 32.0–36.0)
MCV: 77.9 fL — AB (ref 80.0–100.0)
Platelets: 337 10*3/uL (ref 150–440)
RBC: 4.73 MIL/uL (ref 3.80–5.20)
RDW: 15.1 % — ABNORMAL HIGH (ref 11.5–14.5)
WBC: 11.8 10*3/uL — AB (ref 3.6–11.0)

## 2015-06-28 LAB — LIPASE, BLOOD: LIPASE: 19 U/L (ref 11–51)

## 2015-06-28 MED ORDER — ONDANSETRON 4 MG PO TBDP
4.0000 mg | ORAL_TABLET | Freq: Once | ORAL | Status: AC
  Administered 2015-06-28: 4 mg via ORAL
  Filled 2015-06-28: qty 1

## 2015-06-28 MED ORDER — ONDANSETRON 4 MG PO TBDP: 4.0000 mg | ORAL_TABLET | Freq: Three times a day (TID) | ORAL | Status: DC | PRN

## 2015-06-28 MED ORDER — ALBUTEROL SULFATE (2.5 MG/3ML) 0.083% IN NEBU: 2.5000 mg | INHALATION_SOLUTION | Freq: Once | RESPIRATORY_TRACT | Status: DC

## 2015-06-28 MED ORDER — ONDANSETRON HCL 4 MG/2ML IJ SOLN: 4.0000 mg | Freq: Once | INTRAMUSCULAR | Status: DC

## 2015-06-28 MED ORDER — ALBUTEROL SULFATE HFA 108 (90 BASE) MCG/ACT IN AERS: 2.0000 | INHALATION_SPRAY | Freq: Four times a day (QID) | RESPIRATORY_TRACT | Status: DC | PRN

## 2015-06-28 MED ORDER — MORPHINE SULFATE (PF) 4 MG/ML IV SOLN: 4.0000 mg | Freq: Once | INTRAVENOUS | Status: DC

## 2015-06-28 MED ORDER — SODIUM CHLORIDE 0.9 % IV BOLUS (SEPSIS): 1000.0000 mL | Freq: Once | INTRAVENOUS | Status: DC

## 2015-06-28 MED ORDER — AMLODIPINE BESYLATE 5 MG PO TABS: 5.0000 mg | ORAL_TABLET | Freq: Every day | ORAL | Status: DC

## 2015-06-28 MED ORDER — OXYCODONE-ACETAMINOPHEN 5-325 MG PO TABS
1.0000 | ORAL_TABLET | Freq: Once | ORAL | Status: AC
  Administered 2015-06-28: 1 via ORAL
  Filled 2015-06-28: qty 1

## 2015-06-28 MED ORDER — OXYCODONE-ACETAMINOPHEN 5-325 MG PO TABS: 1.0000 | ORAL_TABLET | ORAL | Status: DC | PRN

## 2015-06-28 NOTE — ED Notes (Addendum)
Pt to triage via w/c with no distress noted; st has been seen earlier today and few days ago; c/o persistent left sided abd pain accomp by N/V/D and mid upper CP; dx with gastritis and rx zantac but has not filled

## 2015-06-28 NOTE — Progress Notes (Signed)
Patient: Casey Reese Female    DOB: 09/26/1990   24 y.o.   MRN: [id number] Visit Date: 06/28/2015  Today's Provider: Mila Merry, MD   Chief Complaint  Patient presents with  . Hospitalization Follow-up   Subjective:    HPI   Follow up ER visit  Patient was seen in ER for abdominal pain 06/26/15, 06/27/15, & 06/28/2014. She was treated for abdominal pain. Treatment for this included labs, CT scan, x-rays, ekg and medication. She reports good compliance with treatment. She reports this condition is Improved.  ----------------------------------------------------------------------  Started having cramping earlier this week and went to ER on 2/28. Went to ER again last night when she developed mid sternal burning chest pain and tightness. Had negative chest and abdominal CT. Was prescribed carafate. Borderline anemia when blood drawn yesterday.    Allergies  Allergen Reactions  . Sulfa Antibiotics    Previous Medications   ALBUTEROL (PROVENTIL HFA) 108 (90 BASE) MCG/ACT INHALER    Inhale 2 puffs into the lungs every 6 (six) hours as needed.   FLUTICASONE (FLONASE) 50 MCG/ACT NASAL SPRAY    Place 2 sprays into both nostrils daily.   ONDANSETRON (ZOFRAN ODT) 4 MG DISINTEGRATING TABLET    Take 1 tablet (4 mg total) by mouth every 8 (eight) hours as needed for nausea or vomiting.   OXYCODONE-ACETAMINOPHEN (ROXICET) 5-325 MG TABLET    Take 1 tablet by mouth every 4 (four) hours as needed for severe pain.   RANITIDINE (ZANTAC) 150 MG TABLET    Take 1 tablet (150 mg total) by mouth 2 (two) times daily.   SUCRALFATE (CARAFATE) 1 G TABLET    Take 1 tablet (1 g total) by mouth 4 (four) times daily.    Review of Systems  Constitutional: Negative for fever, chills, appetite change and fatigue.  Respiratory: Positive for shortness of breath. Negative for chest tightness.   Cardiovascular: Positive for chest pain. Negative for palpitations.  Gastrointestinal: Positive for nausea,  vomiting, abdominal pain and diarrhea. Negative for blood in stool.  Neurological: Negative for dizziness and weakness.    Social History  Substance Use Topics  . Smoking status: Current Every Day Smoker    Types: Cigarettes  . Smokeless tobacco: Not on file  . Alcohol Use: 0.0 oz/week    0 Standard drinks or equivalent per week     Comment: occasional   Objective:   BP 150/110 mmHg  Pulse 89  Temp(Src) 98 F (36.7 C) (Oral)  Resp 16  Wt 291 lb (131.997 kg)  SpO2 98%  LMP 05/25/2015 (Approximate)  Physical Exam  General Appearance:    Alert, cooperative, no distress, obese  Eyes:    PERRL, conjunctiva/corneas clear, EOM's intact       Lungs:     Clear to auscultation bilaterally, respirations unlabored  Heart:    Regular rate and rhythm  Neurologic:   Awake, alert, oriented x 3. No apparent focal neurological           defect.           Assessment & Plan:     1. Pain of upper abdomen Continue carafate and raniditine. Check h. pyloir - H. pylori antibody, IgG; Future  2. Essential hypertension Start amloipdine - amLODipine (NORVASC) 5 MG tablet; Take 1 tablet (5 mg total) by mouth daily.  Dispense: 30 tablet; Refill: 2  3. Chest congestion She requests refill for albuterol inhaler which she states has been helpful.  Lelon Huh, MD  [facility]

## 2015-06-28 NOTE — ED Provider Notes (Signed)
[facility] Emergency Department Provider Note  ____________________________________________  Time seen: Approximately 2:20 AM  I have reviewed the triage vital signs and the nursing notes.   HISTORY  Chief Complaint Abdominal Pain and Chest Pain    HPI Casey Reese is a 25 y.o. female who presents to the ED from home with a chief complaint of chest pain. Patient has had 2 visits to the ED recently for left sided abdominal pain accompanied by nausea, vomiting and diarrhea. She had a CT scan yesterday which is negative. This was done for ? ingestion of turkey bone several days ago. CT did not show evidence of perforation or abscess.Patient returns tonight not for abdominal pain, vomiting/diarrhea but for chest pain. States she has had central chest discomfort for the past 3-4 days along with her abdominal pain but tonight she had an increase in her chest pain while trying to lay back approximately 10 PM. Symptoms were not associated with diaphoresis, shortness of breath, palpitations, nausea or vomiting. Describes chest discomfort as a sensation of tightness. Does report ongoing dry cough times several days. Denies recent travel or trauma. Denies fever, chills, dysuria. Nothing makes her symptoms better. Movement makes her symptoms worse.   Past Medical History  Diagnosis Date  . S/P above knee amputation (HCC)   . History of chicken pox   . Diabetes mellitus without complication (HCC)   . 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 Diabetes mellitus (HCC) 06/04/2015  . Morbid obesity (HCC) 01/18/2008  . 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 Above knee leg amputation Left 2003    Current Outpatient Rx  Name  Route  Sig  Dispense  Refill  . albuterol (PROVENTIL HFA) 108 (90 Base) MCG/ACT inhaler   Inhalation   Inhale 2  puffs into the lungs every 6 (six) hours as needed.         . fluticasone (FLONASE) 50 MCG/ACT nasal spray   Each Nare   Place 2 sprays into both nostrils daily.   16 g   1   . ranitidine (ZANTAC) 150 MG tablet   Oral   Take 1 tablet (150 mg total) by mouth 2 (two) times daily. Patient taking differently: Take 150 mg by mouth 2 (two) times daily. Not yet filled   60 tablet   0   . sucralfate (CARAFATE) 1 g tablet   Oral   Take 1 tablet (1 g total) by mouth 4 (four) times daily.   30 tablet   1     Allergies Sulfa antibiotics  Family History  Problem Relation Age of Onset  . Diabetes Mother     type 2    Social History Social History  Substance Use Topics  . Smoking status: Current Every Day Smoker    Types: Cigarettes  . Smokeless tobacco: None  . Alcohol Use: 0.0 oz/week    0 Standard drinks or equivalent per week     Comment: occasional    Review of Systems  Constitutional: No fever/chills. Eyes: No visual changes. ENT: No sore throat. Cardiovascular: Positive for chest pain. Respiratory: Denies shortness of breath. Gastrointestinal: Positive for abdominal pain, nausea, vomiting and diarrhea.  No constipation. Genitourinary: Negative for dysuria. Musculoskeletal: Negative for back pain. Skin: Negative for rash. Neurological: Negative for headaches, focal weakness or numbness.  10-point ROS otherwise   negative.  ____________________________________________   PHYSICAL EXAM:  VITAL SIGNS: ED Triage Vitals  Enc Vitals Group     BP 06/28/15 0034 154/100 mmHg     Pulse Rate 06/28/15 0034 101     Resp 06/28/15 0034 20     Temp 06/28/15 0034 99.2 F (37.3 C)     Temp Source 06/28/15 0034 Oral     SpO2 06/28/15 0034 100 %     Weight 06/28/15 0034 270 lb (122.471 kg)     Height 06/28/15 0034 5' 6" (1.676 m)     Head Cir --      Peak Flow --      Pain Score 06/28/15 0031 10     Pain Loc --      Pain Edu? --      Excl. in GC? --      Constitutional: Alert and oriented. Well appearing and in no acute distress. Eyes: Conjunctivae are normal. PERRL. EOMI. Head: Atraumatic. Nose: No congestion/rhinnorhea. Mouth/Throat: Mucous membranes are moist.  Oropharynx non-erythematous. Neck: No stridor.   Cardiovascular: Normal rate, regular rhythm. Grossly normal heart sounds.  Good peripheral circulation. Respiratory: Normal respiratory effort.  No retractions. Lungs CTAB. Anterior chest wall tender to palpation. Gastrointestinal: Obese. Soft and mildly tender to palpation left upper quadrant without rebound or guarding. No distention. No abdominal bruits. No CVA tenderness. Musculoskeletal: No lower extremity tenderness nor edema.  No joint effusions. Left BKA. Neurologic:  Normal speech and language. No gross focal neurologic deficits are appreciated.  Skin:  Skin is warm, dry and intact. No rash noted. Psychiatric: Mood and affect are normal. Speech and behavior are normal.  ____________________________________________   LABS (all labs ordered are listed, but only abnormal results are displayed)  Labs Reviewed  COMPREHENSIVE METABOLIC PANEL - Abnormal; Notable for the following:    Potassium 3.2 (*)    Glucose, Bld 209 (*)    Calcium 8.5 (*)    All other components within normal limits  CBC - Abnormal; Notable for the following:    WBC 11.8 (*)    MCV 77.9 (*)    MCH 25.7 (*)    RDW 15.1 (*)    All other components within normal limits  LIPASE, BLOOD  TROPONIN I   ____________________________________________  EKG  ED ECG REPORT I, SUNG,JADE J, the attending physician, personally viewed and interpreted this ECG.   Date: 06/28/2015  EKG Time: 0042  Rate: 95  Rhythm: normal EKG, normal sinus rhythm  Axis: Normal  Intervals:none  ST&T Change: Nonspecific  ____________________________________________  RADIOLOGY  Chest 2 view (viewed by me, interpreted per Dr. Bloomer): No acute cardiopulmonary  process for this low respiratory examination. ____________________________________________   PROCEDURES  Procedure(s) performed: None  Critical Care performed: No  ____________________________________________   INITIAL IMPRESSION / ASSESSMENT AND PLAN / ED COURSE  Pertinent labs & imaging results that were available during my care of the patient were reviewed by me and considered in my medical decision making (see chart for details).  24-year-old female with a history of diabetes, morbid obesity s/p left BKA who presents for the third time in 3 days. The 2 prior visits were for a chief complaint of left upper abdominal pain after possibly swallowing a turkey bone. Now patient is presenting with central chest discomfort. She is in no acute distress, room air saturations 100%. Repeat lab work unremarkable including negative troponin. As patient's GFR is normal and she is having a myriad of symptoms in the setting of   possible ingested foreign body, will obtain CT chest to evaluate for foreign body as well as mediastinitis, although I have lower suspicion of that given patient is clinically well-appearing. She feels that she does not have "a lot of air in her lungs"; will try nebulizer treatments to open up her small airways.  ----------------------------------------- 4:07 AM on 06/28/2015 -----------------------------------------  Patient was asleep when nurse attempted to administer her medications and she did not want them. Noncontrasted CT chest was obtained secondary to patient's recent dye load at 5 PM last evening. Updated patient and family of negative CT results. She tells me she only took Carafate one day and she has not yet filled her Zantac prescription. I encouraged her to fill both and take them as prescribed. Will provide limited prescription for analgesia and antiemetic. Strict return precautions given. Patient family members verbalize understanding and agree with plan of  care. ____________________________________________   FINAL CLINICAL IMPRESSION(S) / ED DIAGNOSES  Final diagnoses:  Chest pain, unspecified chest pain type  Pain      Jade J Sung, MD 06/28/15 0656 

## 2015-06-28 NOTE — ED Notes (Signed)
Patient to ED again today for a tight feeling in her chest. During her examination she questions whether she has the flu and also questions if labs and chest xray are okay. EDP went over those items with her and she then stated she was having some stomach issues as well. States she ate some soup earlier today and threw it up and that's when the chest tightness started. States she is not short of breath but feels like her lungs don't have a lot of air in them. Skin color is normal for ethnicity and patient is able to speak in complete sentences without difficulty.

## 2015-06-28 NOTE — Discharge Instructions (Signed)
1. Fill and take your prescriptions for both Carafate and Zantac as previously prescribed. 2. You may take pain and nausea medicines as needed (Percocet/Zofran #15). 3. Eat a bland diet for the next 5 days, then slowly advance as tolerated. Avoid greasy, spicy foods and drinks. 4. Return to the ER for worsening symptoms, persistent vomiting, difficulty breathing or other concerns.  Chest Wall Pain Chest wall pain is pain in or around the bones and muscles of your chest. Sometimes, an injury causes this pain. Sometimes, the cause may not be known. This pain may take several weeks or longer to get better. HOME CARE INSTRUCTIONS  Pay attention to any changes in your symptoms. Take these actions to help with your pain:   Rest as told by your health care provider.   Avoid activities that cause pain. These include any activities that use your chest muscles or your abdominal and side muscles to lift heavy items.   If directed, apply ice to the painful area:  Put ice in a plastic bag.  Place a towel between your skin and the bag.  Leave the ice on for 20 minutes, 2-3 times per day.  Take over-the-counter and prescription medicines only as told by your health care provider.  Do not use tobacco products, including cigarettes, chewing tobacco, and e-cigarettes. If you need help quitting, ask your health care provider.  Keep all follow-up visits as told by your health care provider. This is important. SEEK MEDICAL CARE IF:  You have a fever.  Your chest pain becomes worse.  You have new symptoms. SEEK IMMEDIATE MEDICAL CARE IF:  You have nausea or vomiting.  You feel sweaty or light-headed.  You have a cough with phlegm (sputum) or you cough up blood.  You develop shortness of breath.   This information is not intended to replace advice given to you by your health care provider. Make sure you discuss any questions you have with your health care provider.   Document Released:  04/14/2005 Document Revised: 01/03/2015 Document Reviewed: 07/10/2014 Elsevier Interactive Patient Education 2016 [employer] Inc[employer]  Nonspecific Chest Pain  Chest pain can be caused by many different conditions. There is always a chance that your pain could be related to something serious, such as a heart attack or a blood clot in your lungs. Chest pain can also be caused by conditions that are not life-threatening. If you have chest pain, it is very important to follow up with your health care provider. CAUSES  Chest pain can be caused by:  Heartburn.  Pneumonia or bronchitis.  Anxiety or stress.  Inflammation around your heart (pericarditis) or lung (pleuritis or pleurisy).  A blood clot in your lung.  A collapsed lung (pneumothorax). It can develop suddenly on its own (spontaneous pneumothorax) or from trauma to the chest.  Shingles infection (varicella-zoster virus).  Heart attack.  Damage to the bones, muscles, and cartilage that make up your chest wall. This can include:  Bruised bones due to injury.  Strained muscles or cartilage due to frequent or repeated coughing or overwork.  Fracture to one or more ribs.  Sore cartilage due to inflammation (costochondritis). RISK FACTORS  Risk factors for chest pain may include:  Activities that increase your risk for trauma or injury to your chest.  Respiratory infections or conditions that cause frequent coughing.  Medical conditions or overeating that can cause heartburn.  Heart disease or family history of heart disease.  Conditions or health behaviors that increase your risk of  developing a blood clot.  Having had chicken pox (varicella zoster). SIGNS AND SYMPTOMS Chest pain can feel like:  Burning or tingling on the surface of your chest or deep in your chest.  Crushing, pressure, aching, or squeezing pain.  Dull or sharp pain that is worse when you move, cough, or take a deep breath.  Pain that is also felt in  your back, neck, shoulder, or arm, or pain that spreads to any of these areas. Your chest pain may come and go, or it may stay constant. DIAGNOSIS Lab tests or other studies may be needed to find the cause of your pain. Your health care provider may have you take a test called an ambulatory ECG (electrocardiogram). An ECG records your heartbeat patterns at the time the test is performed. You may also have other tests, such as:  Transthoracic echocardiogram (TTE). During echocardiography, sound waves are used to create a picture of all of the heart structures and to look at how blood flows through your heart.  Transesophageal echocardiogram (TEE).This is a more advanced imaging test that obtains images from inside your body. It allows your health care provider to see your heart in finer detail.  Cardiac monitoring. This allows your health care provider to monitor your heart rate and rhythm in real time.  Holter monitor. This is a portable device that records your heartbeat and can help to diagnose abnormal heartbeats. It allows your health care provider to track your heart activity for several days, if needed.  Stress tests. These can be done through exercise or by taking medicine that makes your heart beat more quickly.  Blood tests.  Imaging tests. TREATMENT  Your treatment depends on what is causing your chest pain. Treatment may include:  Medicines. These may include:  Acid blockers for heartburn.  Anti-inflammatory medicine.  Pain medicine for inflammatory conditions.  Antibiotic medicine, if an infection is present.  Medicines to dissolve blood clots.  Medicines to treat coronary artery disease.  Supportive care for conditions that do not require medicines. This may include:  Resting.  Applying heat or cold packs to injured areas.  Limiting activities until pain decreases. HOME CARE INSTRUCTIONS  If you were prescribed an antibiotic medicine, finish it all even if  you start to feel better.  Avoid any activities that bring on chest pain.  Do not use any tobacco products, including cigarettes, chewing tobacco, or electronic cigarettes. If you need help quitting, ask your health care provider.  Do not drink alcohol.  Take medicines only as directed by your health care provider.  Keep all follow-up visits as directed by your health care provider. This is important. This includes any further testing if your chest pain does not go away.  If heartburn is the cause for your chest pain, you may be told to keep your head raised (elevated) while sleeping. This reduces the chance that acid will go from your stomach into your esophagus.  Make lifestyle changes as directed by your health care provider. These may include:  Getting regular exercise. Ask your health care provider to suggest some activities that are safe for you.  Eating a heart-healthy diet. A registered dietitian can help you to learn healthy eating options.  Maintaining a healthy weight.  Managing diabetes, if necessary.  Reducing stress. SEEK MEDICAL CARE IF:  Your chest pain does not go away after treatment.  You have a rash with blisters on your chest.  You have a fever. SEEK IMMEDIATE MEDICAL CARE IF:  Your chest pain is worse.  You have an increasing cough, or you cough up blood.  You have severe abdominal pain.  You have severe weakness.  You faint.  You have chills.  You have sudden, unexplained chest discomfort.  You have sudden, unexplained discomfort in your arms, back, neck, or jaw.  You have shortness of breath at any time.  You suddenly start to sweat, or your skin gets clammy.  You feel nauseous or you vomit.  You suddenly feel light-headed or dizzy.  Your heart begins to beat quickly, or it feels like it is skipping beats. These symptoms may represent a serious problem that is an emergency. Do not wait to see if the symptoms will go away. Get medical  help right away. Call your local emergency services (911 in the [place] Do not drive yourself to the hospital.   This information is not intended to replace advice given to you by your health care provider. Make sure you discuss any questions you have with your health care provider.   Document Released: 01/22/2005 Document Revised: 05/05/2014 Document Reviewed: 11/18/2013 Elsevier Interactive Patient Education [employer]

## 2015-06-29 DIAGNOSIS — E1159 Type 2 diabetes mellitus with other circulatory complications: Secondary | ICD-10-CM | POA: Insufficient documentation

## 2015-06-29 DIAGNOSIS — I10 Essential (primary) hypertension: Secondary | ICD-10-CM

## 2015-06-29 LAB — H PYLORI, IGM, IGG, IGA AB
H Pylori IgG: 7.9 U/mL — ABNORMAL HIGH (ref 0.0–0.8)
H. Pylogi, Iga Abs: <9 units (ref 0.0–8.9)

## 2015-07-02 ENCOUNTER — Encounter: Payer: Medicare Other | Admitting: Family Medicine

## 2015-07-02 ENCOUNTER — Telehealth: Payer: Self-pay | Admitting: Family Medicine

## 2015-07-02 NOTE — Telephone Encounter (Signed)
Pt called r/s her appt for today and wanted to know about her lab results that she had done on Friday 06/30/15.

## 2015-07-02 NOTE — Telephone Encounter (Signed)
Dr. Fisher, have you seen results on patient? Please advise. Thanks!  

## 2015-07-03 ENCOUNTER — Telehealth: Payer: Self-pay | Admitting: Family Medicine

## 2015-07-03 ENCOUNTER — Encounter: Payer: Medicare Other | Admitting: Family Medicine

## 2015-07-03 NOTE — Telephone Encounter (Signed)
Pt called to resch her appt and get her lab results from last week.    Her call back is [phone]  Thanks Barth Kirksteri

## 2015-07-03 NOTE — Telephone Encounter (Signed)
See result note.  

## 2015-07-03 NOTE — Telephone Encounter (Signed)
LMOVM for pt to return call 

## 2015-07-04 NOTE — Telephone Encounter (Signed)
LMOVM for pt to return call 

## 2015-07-06 NOTE — Telephone Encounter (Signed)
Unable to reach pt. Patient has an ov 07/09/15.

## 2015-07-09 ENCOUNTER — Encounter: Payer: Self-pay | Admitting: Family Medicine

## 2015-07-09 ENCOUNTER — Ambulatory Visit (INDEPENDENT_AMBULATORY_CARE_PROVIDER_SITE_OTHER): Payer: Medicare Other | Admitting: Family Medicine

## 2015-07-09 VITALS — BP 140/100 | HR 78 | Temp 98.3°F | Resp 16 | Ht 66.0 in | Wt 295.0 lb

## 2015-07-09 DIAGNOSIS — Z0000 Encounter for general adult medical examination without abnormal findings: Secondary | ICD-10-CM | POA: Diagnosis not present

## 2015-07-09 DIAGNOSIS — I10 Essential (primary) hypertension: Secondary | ICD-10-CM | POA: Diagnosis not present

## 2015-07-09 DIAGNOSIS — Z136 Encounter for screening for cardiovascular disorders: Secondary | ICD-10-CM | POA: Diagnosis not present

## 2015-07-09 DIAGNOSIS — E119 Type 2 diabetes mellitus without complications: Secondary | ICD-10-CM | POA: Diagnosis not present

## 2015-07-09 DIAGNOSIS — Z89612 Acquired absence of left leg above knee: Secondary | ICD-10-CM

## 2015-07-09 DIAGNOSIS — K2970 Gastritis, unspecified, without bleeding: Secondary | ICD-10-CM

## 2015-07-09 DIAGNOSIS — B9681 Helicobacter pylori [H. pylori] as the cause of diseases classified elsewhere: Secondary | ICD-10-CM | POA: Insufficient documentation

## 2015-07-09 MED ORDER — CLARITHROMYCIN 500 MG PO TABS: 500.0000 mg | ORAL_TABLET | Freq: Two times a day (BID) | ORAL | Status: DC

## 2015-07-09 MED ORDER — AMOXICILLIN 500 MG PO CAPS: 1000.0000 mg | ORAL_CAPSULE | Freq: Two times a day (BID) | ORAL | Status: AC

## 2015-07-09 MED ORDER — OMEPRAZOLE 20 MG PO CPDR: 20.0000 mg | DELAYED_RELEASE_CAPSULE | Freq: Every day | ORAL | Status: DC

## 2015-07-09 NOTE — Progress Notes (Signed)
Patient: Casey Reese, Female    DOB:Casey Reese [birth year], 25 y.o.   MRN: [id number] Visit Date: 07/09/2015  Today's Provider: Mila Merryonald Fisher, MD   Chief Complaint  Patient presents with  . Annual Exam  . Diabetes   Subjective:    Annual physical exam Casey PinchSierra B Klepper is a 25 y.o. female who presents today for health maintenance and complete physical. She feels fairly well. She reports exercising yes. She reports she is sleeping poorly.  She was seen at ER last week for stomach pain. We had h. pylori added to ER labs which was positive, but patient has not yet been informaed of results. She has been taking carafate with minimal improvement in stomach pains.  -----------------------------------------------------------------   Hypertension She has been prescribed amlodipine but reports she has not been taking it lately   Diabetes Mellitus Type II, Follow-up:   Lab Results  Component Value Date   HGBA1C 6.7 08/04/2013    Last seen for diabetes 08/04/2013.  Management since then includes; controlled. Advised to work on diet and exercise. She reports fair compliance with treatment. She is not having side effects. none Current symptoms include none and have been none. Home blood sugar records: not checking blood sugar  Episodes of hypoglycemia? no   Current Insulin Regimen: n/a Most Recent Eye Exam: due Weight trend: stable Prior visit with dietician: no Current diet: in general, a "healthy" diet   Current exercise: none  Pertinent Labs:    Component Value Date/Time   CREATININE 0.75 06/28/2015 0041   CREATININE 0.74 04/01/2014 1725    Wt Readings from Last 3 Encounters:  07/09/15 295 lb (133.811 kg)  06/28/15 291 lb (131.997 kg)  06/28/15 270 lb (122.471 kg)    ----------------------------------------------------------------------     Review of Systems  Constitutional: Negative.   HENT: Negative.   Eyes: Negative.   Respiratory: Negative.     Cardiovascular: Negative.   Gastrointestinal: Positive for abdominal pain.  Endocrine: Negative.   Genitourinary: Negative.   Musculoskeletal: Negative.   Skin: Negative.   Allergic/Immunologic: Negative.   Neurological: Negative.   Hematological: Negative.   Psychiatric/Behavioral: Negative.     Social History      She  reports that she has been smoking Cigarettes.  She does not have any smokeless tobacco history on file. She reports that she drinks alcohol. She reports that she does not use illicit drugs.       Social History   Social History  . Marital Status: Single    Spouse Name: N/A  . Number of Children: N/A  . Years of Education: N/A   Social History Main Topics  . Smoking status: Current Every Day Smoker    Types: Cigarettes  . Smokeless tobacco: None  . Alcohol Use: 0.0 oz/week    0 Standard drinks or equivalent per week     Comment: occasional  . Drug Use: No  . Sexual Activity: Not Asked   Other Topics Concern  . None   Social History Narrative    Past Medical History  Diagnosis Date  . S/P above knee amputation (HCC)   . History of chicken pox   . Diabetes mellitus without complication (HCC)   . 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 Essential hypertension 06/29/2015  . Diabetes mellitus (HCC) 06/04/2015  . Morbid obesity (HCC) 01/18/2008  . Status post above knee amputation (HCC)  04/28/2001    Past Surgical History  Procedure Laterality Date  . Above knee leg amputation Left 2003    Family History        Family Status  Relation Status Death Age  . Mother Alive   . Father Deceased 60    work related        Her family history includes Diabetes in her mother.    Allergies  Allergen Reactions  . Sulfa Antibiotics     Previous Medications   ALBUTEROL (PROVENTIL HFA) 108 (90 BASE) MCG/ACT INHALER    Inhale 2 puffs into the lungs every 6 (six) hours as needed.    FLUTICASONE (FLONASE) 50 MCG/ACT NASAL SPRAY    Place 2 sprays into both nostrils daily.   ONDANSETRON (ZOFRAN ODT) 4 MG DISINTEGRATING TABLET    Take 1 tablet (4 mg total) by mouth every 8 (eight) hours as needed for nausea or vomiting.   RANITIDINE (ZANTAC) 150 MG TABLET    Take 1 tablet (150 mg total) by mouth 2 (two) times daily.   SUCRALFATE (CARAFATE) 1 G TABLET    Take 1 tablet (1 g total) by mouth 4 (four) times daily.    Patient Care Team: Malva Limes, MD as PCP - General (Family Medicine)     Objective:   Vitals: BP 140/100 mmHg  Pulse 78  Temp(Src) 98.3 F (36.8 C) (Oral)  Resp 16  Ht  (1.676 m)  Wt 295 lb (133.811 kg)  BMI 47.64 kg/m2  SpO2 100%  LMP 05/25/2015 (Approximate)   Physical Exam   General Appearance:    Alert, cooperative, no distress, appears stated age, obese  Head:    Normocephalic, without obvious abnormality, atraumatic  Eyes:    PERRL, conjunctiva/corneas clear, EOM's intact, fundi    benign, both eyes  Ears:    Normal TM's and external ear canals, both ears  Nose:   Nares normal, septum midline, mucosa normal, no drainage    or sinus tenderness  Throat:   Lips, mucosa, and tongue normal; teeth and gums normal  Neck:   Supple, symmetrical, trachea midline, no adenopathy;    thyroid:  no enlargement/tenderness/nodules; no carotid   bruit or JVD  Back:     Symmetric, no curvature, ROM normal, no CVA tenderness  Lungs:     Clear to auscultation bilaterally, respirations unlabored  Chest Wall:    No tenderness or deformity   Heart:    Regular rate and rhythm, S1 and S2 normal, no murmur, rub   or gallop  Breast Exam:    normal appearance, no masses or tenderness, deferred  Abdomen:     Soft, non-tender, bowel sounds active all four quadrants,    no masses, no organomegaly  Pelvic:    deferred. Patient refused. Has never been sexually active.   Extremities:   Extremities normal, atraumatic, no cyanosis or edema. S/p right AKA.   Pulses:    2+ and symmetric all extremities  Skin:   Skin color, texture, turgor normal, no rashes or lesions  Lymph nodes:   Cervical, supraclavicular, and axillary nodes normal  Neurologic:   CNII-XII intact, normal strength, sensation and reflexes    throughout    Depression Screen PHQ 2/9 Scores 07/09/2015  PHQ - 2 Score 0    Results for orders placed or performed in visit on 07/09/15  Lipid panel  Result Value Ref Range   Cholesterol, Total 134 100 - 199 mg/dL   Triglycerides 50 0 -  149 mg/dL   HDL 46 >16 mg/dL   VLDL Cholesterol Cal 10 5 - 40 mg/dL   LDL Calculated 78 0 - 99 mg/dL   Chol/HDL Ratio 2.9 0.0 - 4.4 ratio units  TSH  Result Value Ref Range   TSH 1.650 0.450 - 4.500 uIU/mL  Hemoglobin A1c  Result Value Ref Range   Hgb A1c MFr Bld 8.8 (H) 4.8 - 5.6 %   Est. average glucose Bld gHb Est-mCnc 206 mg/dL     Assessment & Plan:     IPPE Exercise Activities and Dietary recommendations Goals    None      Immunization History  Administered Date(s) Administered  . Tdap 01/18/2008    Health Maintenance  Topic Date Due  . PNEUMOCOCCAL POLYSACCHARIDE VACCINE (1) 07/10/1990  . FOOT EXAM  07/10/1998  . OPHTHALMOLOGY EXAM  07/10/1998  . URINE MICROALBUMIN  07/10/1998  . HIV Screening  07/10/2003  . PAP SMEAR  07/09/2009  . INFLUENZA VACCINE  12/27/2015 (Originally 11/27/2014)  . HEMOGLOBIN A1C  01/09/2016  . TETANUS/TDAP  01/17/2018      Discussed health benefits of physical activity, and encouraged her to engage in regular exercise appropriate for her age and condition.    --------------------------------------------------------------------  1. Initial Medicare annual wellness visit   2. Screening for cardiovascular condition  - Lipid panel  3. Helicobacter pylori gastritis  - amoxicillin (AMOXIL) 500 MG capsule; Take 2 capsules (1,000 mg total) by mouth 2 (two) times daily.  Dispense: 40 capsule; Refill: 0 - clarithromycin (BIAXIN) 500 MG tablet; Take 1  tablet (500 mg total) by mouth 2 (two) times daily.  Dispense: 20 tablet; Refill: 0 - omeprazole (PRILOSEC) 20 MG capsule; Take 1 capsule (20 mg total) by mouth daily.  Dispense: 20 capsule; Refill: 0  4. Status post above knee amputation of left lower extremity (HCC)   5. Type 2 diabetes mellitus without complication, without long-term current use of insulin (HCC) Uncontrolled, will start metformin after finishing treatment for h. pylori - Lipid panel - Hemoglobin A1c  6. Essential hypertension Stable. Continue current medications.   - Lipid panel - TSH

## 2015-07-10 LAB — HEMOGLOBIN A1C
ESTIMATED AVERAGE GLUCOSE: 206 mg/dL
Hgb A1c MFr Bld: 8.8 % — ABNORMAL HIGH (ref 4.8–5.6)

## 2015-07-10 LAB — LIPID PANEL
CHOL/HDL RATIO: 2.9 ratio (ref 0.0–4.4)
Cholesterol, Total: 134 mg/dL (ref 100–199)
HDL: 46 mg/dL (ref >39)
LDL Calculated: 78 mg/dL (ref 0–99)
TRIGLYCERIDES: 50 mg/dL (ref 0–149)
VLDL Cholesterol Cal: 10 mg/dL (ref 5–40)

## 2015-07-10 LAB — TSH: TSH: 1.65 u[IU]/mL (ref 0.450–4.500)

## 2015-07-13 ENCOUNTER — Encounter: Payer: Self-pay | Admitting: *Deleted

## 2015-07-17 ENCOUNTER — Ambulatory Visit (INDEPENDENT_AMBULATORY_CARE_PROVIDER_SITE_OTHER): Payer: Medicare Other | Admitting: Family Medicine

## 2015-07-17 ENCOUNTER — Encounter: Payer: Self-pay | Admitting: Family Medicine

## 2015-07-17 VITALS — BP 178/116 | HR 91 | Temp 98.8°F | Resp 16 | Wt 289.4 lb

## 2015-07-17 DIAGNOSIS — B9681 Helicobacter pylori [H. pylori] as the cause of diseases classified elsewhere: Secondary | ICD-10-CM

## 2015-07-17 DIAGNOSIS — S8011XA Contusion of right lower leg, initial encounter: Secondary | ICD-10-CM | POA: Diagnosis not present

## 2015-07-17 DIAGNOSIS — K2970 Gastritis, unspecified, without bleeding: Secondary | ICD-10-CM

## 2015-07-17 MED ORDER — OXYCODONE-ACETAMINOPHEN 5-325 MG PO TABS: 1.0000 | ORAL_TABLET | ORAL | Status: DC | PRN

## 2015-07-17 NOTE — Progress Notes (Signed)
Subjective:     Patient ID: Casey Reese, female   DOB: 1990-07-19, 25 y.o.   MRN: [id number]  HPI  Chief Complaint  Patient presents with  . Leg Pain    Patient reports she hit her right leg on the door yesterday. Patient has two small knots on shin bone that are painful with pressure.   Also states abdominal pain still present after one week of treatment for gastritis.   Review of Systems     Objective:   Physical Exam  Constitutional: She appears well-developed and well-nourished. No distress.  Musculoskeletal:  Tender 0.5 cm hematoma over right tibia.       Assessment:    1. Hematoma of leg, right, initial encounter   2. Helicobacter pylori gastritis: case discussed with Dr. Posey ProntoFisher-will have her add Zantac and provide pain medication. - oxyCODONE-acetaminophen (ROXICET) 5-325 MG tablet; Take 1 tablet by mouth every 4 (four) hours as needed for severe pain.  Dispense: 20 tablet; Refill: 0    Plan:    Discussed cold and then warm compresses. Call Dr. Sherrie MustacheFisher next week if abd.pain not improving.

## 2015-07-17 NOTE — Patient Instructions (Addendum)
Use a cold compress for 20 minutes several times a day for the first two days then apply heat. For your stomach pain add Zantac and use pain medication as needed. If pain not improving over the next few days let Dr. Sherrie MustacheFisher know.

## 2015-07-23 ENCOUNTER — Ambulatory Visit (INDEPENDENT_AMBULATORY_CARE_PROVIDER_SITE_OTHER): Payer: Medicare Other | Admitting: Family Medicine

## 2015-07-23 ENCOUNTER — Encounter: Payer: Self-pay | Admitting: Family Medicine

## 2015-07-23 VITALS — BP 140/90 | HR 103 | Temp 98.3°F | Resp 16 | Ht 66.0 in | Wt 288.0 lb

## 2015-07-23 DIAGNOSIS — M7989 Other specified soft tissue disorders: Secondary | ICD-10-CM

## 2015-07-23 NOTE — Progress Notes (Signed)
       Patient: Casey PinchSierra B Fishel Female    DOB: 07/31/1990   25 y.o.   MRN: [id number] Visit Date: 07/23/2015  Today's Provider: Mila Merryonald Tynia Wiers, MD   Chief Complaint  Patient presents with  . Leg Pain   Subjective:    Leg Pain  The incident occurred 3 to 5 days ago (4 days ago). The injury mechanism was a direct blow (hit her shin). The pain is present in the right leg (right calf). The quality of the pain is described as aching. The pain is at a severity of 5/10. The pain is moderate. The pain has been fluctuating since onset. Pertinent negatives include no inability to bear weight, loss of motion, loss of sensation, muscle weakness, numbness or tingling. She reports no foreign bodies present. The symptoms are aggravated by movement and weight bearing. She has tried rest and elevation (oxycodone) for the symptoms. The treatment provided mild relief.   Patient saw Toni ArthursBob Chauvin 07/17/15 for hematoma of right leg. Her right shin was hit by a door. She was prescribed oxycodone 5-325 mg every 4 hours as needed. Two days later her right calf began to hurt. Reports that the veins on side of right calf are swollen and hurts.    Allergies  Allergen Reactions  . Sulfa Antibiotics    Previous Medications   ALBUTEROL (PROVENTIL HFA) 108 (90 BASE) MCG/ACT INHALER    Inhale 2 puffs into the lungs every 6 (six) hours as needed.   AMLODIPINE (NORVASC) 5 MG TABLET    TK 1 T PO D   OMEPRAZOLE (PRILOSEC) 20 MG CAPSULE    Take 1 capsule (20 mg total) by mouth daily.   OXYCODONE-ACETAMINOPHEN (ROXICET) 5-325 MG TABLET    Take 1 tablet by mouth every 4 (four) hours as needed for severe pain.   RANITIDINE (ZANTAC) 150 MG TABLET    Take 1 tablet (150 mg total) by mouth 2 (two) times daily.    Review of Systems  Constitutional: Negative for fever, chills, appetite change and fatigue.  Respiratory: Negative for chest tightness and shortness of breath.   Cardiovascular: Negative for chest pain and  palpitations.  Gastrointestinal: Negative for nausea, vomiting and abdominal pain.  Neurological: Negative for dizziness, tingling, weakness and numbness.    Social History  Substance Use Topics  . Smoking status: Current Every Day Smoker    Types: Cigarettes  . Smokeless tobacco: Not on file  . Alcohol Use: 0.0 oz/week    0 Standard drinks or equivalent per week     Comment: occasional   Objective:   BP 140/90 mmHg  Pulse 103  Temp(Src) 98.3 F (36.8 C) (Oral)  Resp 16  Ht 5\' 6"  (1.676 m)  Wt 288 lb (130.636 kg)  BMI 46.51 kg/m2  SpO2 98%  LMP 05/25/2015 (Approximate)  Physical Exam  General appearance: alert, well developed, well nourished, cooperative and in no distress Head: Normocephalic, without obvious abnormality, atraumatic Lungs: Respirations even and unlabored Extremities: s/p left BKA. Right calf moderately swollen and tender. Slightly erythematous. Some prominent varicosities.       Assessment & Plan:           Mila Merryonald Leianne Callins, MD  [facility]

## 2015-07-24 ENCOUNTER — Ambulatory Visit
Admission: RE | Admit: 2015-07-24 | Discharge: 2015-07-24 | Disposition: A | Discharge status: Home / Self Care | Payer: Medicare Other | Source: Ambulatory Visit | Attending: Family Medicine | Admitting: Family Medicine

## 2015-07-24 ENCOUNTER — Telehealth: Payer: Self-pay

## 2015-07-24 DIAGNOSIS — M7989 Other specified soft tissue disorders: Secondary | ICD-10-CM | POA: Insufficient documentation

## 2015-07-24 NOTE — Telephone Encounter (Signed)
-----   Message from Malva Limesonald E Fisher, MD sent at 07/24/2015  1:42 PM EDT ----- No sign of blood clot. Swelling is probably due to muscle strain in calf and should resolve within 2 weeks.

## 2015-07-24 NOTE — Telephone Encounter (Signed)
LMTCB Emily Drozdowski, CMA  

## 2015-07-26 NOTE — Telephone Encounter (Signed)
Advised patient as below.  

## 2015-07-26 NOTE — Telephone Encounter (Signed)
Pt is returning call.  CB#[phone]/MW

## 2015-07-26 NOTE — Telephone Encounter (Signed)
Tried calling patient. Left message to call back. 

## 2015-07-27 ENCOUNTER — Emergency Department
Admission: EM | Admit: 2015-07-27 | Discharge: 2015-07-27 | Disposition: A | Discharge status: Home / Self Care | Payer: Medicare Other | Attending: Emergency Medicine | Admitting: Emergency Medicine

## 2015-07-27 ENCOUNTER — Encounter: Payer: Self-pay | Admitting: Emergency Medicine

## 2015-07-27 DIAGNOSIS — R42 Dizziness and giddiness: Secondary | ICD-10-CM | POA: Diagnosis not present

## 2015-07-27 DIAGNOSIS — E119 Type 2 diabetes mellitus without complications: Secondary | ICD-10-CM | POA: Diagnosis not present

## 2015-07-27 DIAGNOSIS — F17210 Nicotine dependence, cigarettes, uncomplicated: Secondary | ICD-10-CM | POA: Insufficient documentation

## 2015-07-27 LAB — URINALYSIS COMPLETE WITH MICROSCOPIC (ARMC ONLY)
BILIRUBIN URINE: NEGATIVE
Bacteria, UA: NONE SEEN
Glucose, UA: NEGATIVE mg/dL
Hgb urine dipstick: NEGATIVE
Ketones, ur: NEGATIVE mg/dL
Nitrite: NEGATIVE
PH: 7 (ref 5.0–8.0)
PROTEIN: NEGATIVE mg/dL
Specific Gravity, Urine: 1.017 (ref 1.005–1.030)

## 2015-07-27 LAB — BASIC METABOLIC PANEL
Anion gap: 7 (ref 5–15)
BUN: 10 mg/dL (ref 6–20)
CHLORIDE: 103 mmol/L (ref 101–111)
CO2: 24 mmol/L (ref 22–32)
CREATININE: 0.54 mg/dL (ref 0.44–1.00)
Calcium: 9.1 mg/dL (ref 8.9–10.3)
GFR calc non Af Amer: >60 mL/min (ref >60)
Glucose, Bld: 114 mg/dL — ABNORMAL HIGH (ref 65–99)
Potassium: 3.7 mmol/L (ref 3.5–5.1)
Sodium: 134 mmol/L — ABNORMAL LOW (ref 135–145)

## 2015-07-27 LAB — CBC
HCT: 39.1 % (ref 35.0–47.0)
Hemoglobin: 12.9 g/dL (ref 12.0–16.0)
MCH: 26.1 pg (ref 26.0–34.0)
MCHC: 33 g/dL (ref 32.0–36.0)
MCV: 79 fL — AB (ref 80.0–100.0)
PLATELETS: 362 10*3/uL (ref 150–440)
RBC: 4.95 MIL/uL (ref 3.80–5.20)
RDW: 14.9 % — ABNORMAL HIGH (ref 11.5–14.5)
WBC: 9.6 10*3/uL (ref 3.6–11.0)

## 2015-07-27 LAB — TROPONIN I

## 2015-07-27 LAB — POCT PREGNANCY, URINE: PREG TEST UR: NEGATIVE

## 2015-07-27 NOTE — ED Provider Notes (Signed)
[facility] Emergency Department Provider Note  ____________________________________________    I have reviewed the triage vital signs and the nursing notes.   HISTORY  Chief Complaint Dizziness    HPI Casey Reese is a 25 y.o. female who presents with complaints of intermittent dizzy spells over the last week. She reports her PCP recently put her on amlodipine 5 mg but she feels this was making her dizzy so she stopped it 2 days ago. She reports she had a dizzy episode today although in the emergency room she feels well and has no complaints. She denies chest pain to me. No palpitations. No recent travel. No pleurisy. No calf pain or swelling.     Past Medical History  Diagnosis Date  . S/P above knee amputation (HCC)   . History of chicken pox   . Diabetes mellitus without complication (HCC)   . 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 Helicobacter pylori gastritis 07/09/2015  . Essential hypertension 06/29/2015  . Diabetes mellitus (HCC) 06/04/2015  . Morbid obesity (HCC) 01/18/2008  . 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 Above knee leg amputation Left 2003    Current Outpatient Rx  Name  Route  Sig  Dispense  Refill  . albuterol (PROVENTIL HFA) 108 (90 Base) MCG/ACT inhaler   Inhalation   Inhale 2 puffs into the lungs every 6 (six) hours as needed.   18 g   3   . amLODipine (NORVASC) 5 MG tablet      TK 1 T PO D      2   . EXPIRED: omeprazole (PRILOSEC) 20 MG capsule   Oral   Take 1 capsule (20 mg total) by mouth daily.   20 capsule   0   . oxyCODONE-acetaminophen (ROXICET) 5-325 MG tablet   Oral   Take 1 tablet by mouth every 4 (four) hours as needed for severe pain.   20 tablet   0   . ranitidine (ZANTAC) 150 MG tablet   Oral   Take 1 tablet (150 mg total) by mouth 2 (two) times  daily. Patient taking differently: Take 150 mg by mouth 2 (two) times daily. Not yet filled   60 tablet   0     Allergies Sulfa antibiotics  Family History  Problem Relation Age of Onset  . Diabetes Mother     type 2    Social History Social History  Substance Use Topics  . Smoking status: Current Every Day Smoker    Types: Cigarettes  . Smokeless tobacco: None  . Alcohol Use: 0.0 oz/week    0 Standard drinks or equivalent per week     Comment: occasional    Review of Systems  Constitutional: Negative for fever. Eyes: Negative for redness ENT: Negative for sore throat Cardiovascular: Negative for chest pain, no palpitations Respiratory: Negative for shortness of breath. Gastrointestinal: Negative for abdominal pain Genitourinary: Negative for dysuria. Musculoskeletal: Negative for back pain. Skin: Negative for rash. Neurological: Negative for focal weakness, no headache Psychiatric: no anxiety    ____________________________________________   PHYSICAL EXAM:  VITAL SIGNS: ED Triage Vitals  Enc Vitals Group     BP 07/27/15 1842 135/78 mmHg     Pulse Rate 07/27/15 1842 90     Resp 07/27/15 1842 18     Temp 07/27/15 1842 97.8 F (  36.6 C)     Temp Source 07/27/15 1842 Oral     SpO2 07/27/15 1842 100 %     Weight 07/27/15 1842 280 lb (127.007 kg)     Height 07/27/15 1842  (1.676 m)     Head Cir --      Peak Flow --      Pain Score 07/27/15 1927 0     Pain Loc --      Pain Edu? --      Excl. in GC? --      Constitutional: Alert and oriented. Well appearing and in no distress.  Eyes: Conjunctivae are normal. No erythema or injection ENT   Head: Normocephalic and atraumatic.   Mouth/Throat: Mucous membranes are moist. Cardiovascular: Normal rate, regular rhythm. Normal and symmetric distal pulses are present in the upper extremities.  Respiratory: Normal respiratory effort without tachypnea nor retractions. Breath sounds are clear and equal  bilaterally.   Genitourinary: deferred Musculoskeletal: Patient with left BKA, otherwise normal chemistries Neurologic:  Normal speech and language. No gross focal neurologic deficits are appreciated. Skin:  Skin is warm, dry and intact. No rash noted. Psychiatric: Mood and affect are normal. Patient exhibits appropriate insight and judgment.  ____________________________________________    LABS (pertinent positives/negatives)  Labs Reviewed  BASIC METABOLIC PANEL - Abnormal; Notable for the following:    Sodium 134 (*)    Glucose, Bld 114 (*)    All other components within normal limits  CBC - Abnormal; Notable for the following:    MCV 79.0 (*)    RDW 14.9 (*)    All other components within normal limits  URINALYSIS COMPLETEWITH MICROSCOPIC ([facility] ONLY) - Abnormal; Notable for the following:    Color, Urine YELLOW (*)    APPearance HAZY (*)    Leukocytes, UA 2+ (*)    Squamous Epithelial / LPF 6-30 (*)    All other components within normal limits  TROPONIN I  CBG MONITORING, ED  POC URINE PREG, ED  POCT PREGNANCY, URINE    ____________________________________________   EKG  ED ECG REPORT I, Jene Every, the attending physician, personally viewed and interpreted this ECG.  Date: 07/27/2015 EKG Time: 6:37 PM Rate: 87 Rhythm: normal sinus rhythm QRS Axis: normal Intervals: normal ST/T Wave abnormalities: normal Conduction Disturbances: none Narrative Interpretation: unremarkable   ____________________________________________    RADIOLOGY  None  ____________________________________________   PROCEDURES  Procedure(s) performed: none  Critical Care performed: none  ____________________________________________   INITIAL IMPRESSION / ASSESSMENT AND PLAN / ED COURSE  Pertinent labs & imaging results that were available during my care of the patient were reviewed by me and considered in my medical decision making (see chart for  details).  Patient well-appearing and in no distress. Her EKG is unremarkable. Her lab work is benign. Her exam is normal. She is not having symptoms in the emergency department. Feel outpatient workup is appropriate. Symptoms could be related to blood pressure medication still encouraged hydration and follow-up with PCP  ____________________________________________   FINAL CLINICAL IMPRESSION(S) / ED DIAGNOSES  Final diagnoses:  Dizziness          Jene Every, MD 07/27/15 2302

## 2015-07-27 NOTE — ED Notes (Signed)
Has had a couple episodes of chest pain over last week as well.

## 2015-07-27 NOTE — ED Notes (Signed)
Pt has been having "dizzy spells" over last week. Was recently put on bp med but stopped it 2 days ago.  Pt still having dizzy spells despite stopping med.  Dizziness does not seem to be related to position or activity. Has had chills.

## 2015-07-27 NOTE — Discharge Instructions (Signed)

## 2015-07-31 ENCOUNTER — Ambulatory Visit: Payer: Medicare Other | Admitting: Family Medicine

## 2015-08-16 ENCOUNTER — Telehealth: Payer: Self-pay

## 2015-08-16 NOTE — Telephone Encounter (Signed)
Patient called to schedule an appointment. She states Dr. Sherrie MustacheFisher put her on a blood pressure medication (Amlodipine) and it made her dizzy. She stopped taking Amlodipine about 2 weeks ago and the dizziness resolved for about 1 week then returned. Patient was seen in the ER on 07/27/2015 and she states they really didn't do anything. Patient was wanting to have a CT done. Her blood pressure was elevated last night at 160/98. She still has dizziness iff and on. Patient denies any numbness, chest pain, shortness of breath or swelling. She states she has had some blurred vision, but she had that before taking the medication. Patient has been scheduled tomorrow at 10am for ER follow up.

## 2015-08-17 ENCOUNTER — Ambulatory Visit: Payer: Self-pay | Admitting: Family Medicine

## 2015-08-17 ENCOUNTER — Inpatient Hospital Stay: Payer: Self-pay | Admitting: Family Medicine

## 2015-08-20 ENCOUNTER — Ambulatory Visit (INDEPENDENT_AMBULATORY_CARE_PROVIDER_SITE_OTHER): Payer: Medicare Other | Admitting: Family Medicine

## 2015-08-20 ENCOUNTER — Encounter: Payer: Self-pay | Admitting: Family Medicine

## 2015-08-20 VITALS — BP 130/90 | HR 81 | Temp 98.3°F | Resp 16 | Ht 66.0 in | Wt 288.0 lb

## 2015-08-20 DIAGNOSIS — R42 Dizziness and giddiness: Secondary | ICD-10-CM

## 2015-08-20 DIAGNOSIS — K2970 Gastritis, unspecified, without bleeding: Secondary | ICD-10-CM

## 2015-08-20 DIAGNOSIS — I10 Essential (primary) hypertension: Secondary | ICD-10-CM

## 2015-08-20 DIAGNOSIS — B9681 Helicobacter pylori [H. pylori] as the cause of diseases classified elsewhere: Secondary | ICD-10-CM

## 2015-08-20 DIAGNOSIS — E119 Type 2 diabetes mellitus without complications: Secondary | ICD-10-CM | POA: Diagnosis not present

## 2015-08-20 DIAGNOSIS — R1010 Upper abdominal pain, unspecified: Secondary | ICD-10-CM

## 2015-08-20 MED ORDER — MECLIZINE HCL 25 MG PO TABS: 25.0000 mg | ORAL_TABLET | Freq: Three times a day (TID) | ORAL | Status: DC | PRN

## 2015-08-20 MED ORDER — DILTIAZEM HCL ER COATED BEADS 120 MG PO CP24: 120.0000 mg | ORAL_CAPSULE | Freq: Every day | ORAL | Status: DC

## 2015-08-20 MED ORDER — METFORMIN HCL ER 500 MG PO TB24: 500.0000 mg | ORAL_TABLET | Freq: Every day | ORAL | Status: DC

## 2015-08-20 MED ORDER — OXYCODONE-ACETAMINOPHEN 5-325 MG PO TABS: 1.0000 | ORAL_TABLET | ORAL | Status: DC | PRN

## 2015-08-20 NOTE — Progress Notes (Signed)
Patient: Casey Reese Female    DOB: 25-Feb-1991   25 y.o.   MRN: [id number] Visit Date: 08/20/2015  Today's Provider: Mila Merryonald Emilynn Srinivasan, MD   Chief Complaint  Patient presents with  . Hospitalization Follow-up   Subjective:    HPI   Follow Up ER Visit  Patient is here for ER follow up.  She was recently seen at [facility] for dizziness and hypertension on 07/27/2015. Treatment for this included: labs, ekg She reports good compliance with treatment. She reports this condition is Improved.  ----------------------------------------------------------------------  Patient states blood pressure medication (Amlodipine) made her dizzy. She stopped taking Amlodipine about 2 weeks ago and the dizziness resolved for about 1 week then returned. Patient was seen in the ER on 07/27/2015 and she states they really didn't do anything. Patient was wanting to have a CT done. Her outside blood pressures have been 150/99. She still has dizziness off and on. Patient denies any numbness, chest pain, shortness of breath or swelling. She states she has had some blurred vision, but she had that before taking the medication.   Follow up h. Pylori. She has completed first line triple therapy, but continues to have persistent epigastic pain, worse after eating.   Follow up diabetes. Lab Results  Component Value Date   HGBA1C 8.8* 07/09/2015      Allergies  Allergen Reactions  . Sulfa Antibiotics    Previous Medications   ALBUTEROL (PROVENTIL HFA) 108 (90 BASE) MCG/ACT INHALER    Inhale 2 puffs into the lungs every 6 (six) hours as needed.   AMLODIPINE (NORVASC) 5 MG TABLET    TK 1 T PO D   RANITIDINE (ZANTAC) 150 MG TABLET    Take 1 tablet (150 mg total) by mouth 2 (two) times daily.    Review of Systems  Constitutional: Negative for fever, chills, appetite change and fatigue.  Respiratory: Negative for chest tightness and shortness of breath.   Cardiovascular: Negative for chest pain and  palpitations.  Gastrointestinal: Negative for nausea, vomiting and abdominal pain.  Neurological: Positive for dizziness, light-headedness and headaches. Negative for weakness.    Social History  Substance Use Topics  . Smoking status: Current Every Day Smoker    Types: Cigarettes  . Smokeless tobacco: Not on file  . Alcohol Use: 0.0 oz/week    0 Standard drinks or equivalent per week     Comment: occasional   Objective:   BP 150/110 mmHg  Pulse 81  Temp(Src) 98.3 F (36.8 C) (Oral)  Resp 16  Ht 5\' 6"  (1.676 m)  Wt 288 lb (130.636 kg)  BMI 46.51 kg/m2  SpO2 100%  LMP 06/26/2015 (Approximate)  Physical Exam  General Appearance:    Alert, cooperative, no distress, obese  Eyes:    PERRL, conjunctiva/corneas clear, EOM's intact       Lungs:     Clear to auscultation bilaterally, respirations unlabored  Heart:    Regular rate and rhythm  Neurologic:   Awake, alert, oriented x 3. No apparent focal neurological           defect.           Assessment & Plan:     1. Essential hypertension  - diltiazem (CARDIZEM CD) 120 MG 24 hr capsule; Take 1 capsule (120 mg total) by mouth daily.  Dispense: 30 capsule; Refill: 1  2. Dizziness Likely due to labile blood pressure, but she does describe occasional spinning sensation, will try meclizine for  these episodes.  - meclizine (ANTIVERT) 25 MG tablet; Take 1 tablet (25 mg total) by mouth 3 (three) times daily as needed for dizziness.  Dispense: 30 tablet; Refill: 0  3. Helicobacter pylori gastritis Completed triple therapy, but still with epigastric pain. Need to test for cure - oxyCODONE-acetaminophen (ROXICET) 5-325 MG tablet; Take 1 tablet by mouth every 4 (four) hours as needed for severe pain.  Dispense: 10 tablet; Refill: 0 - H. pylori breath test  4. Pain of upper abdomen   5. Type 2 diabetes mellitus without complication, without long-term current use of insulin (HCC) Start metformin ER  daily. Follow up 3 months to  check A1c.          Mila Merry, MD  [facility]

## 2015-08-22 ENCOUNTER — Telehealth: Payer: Self-pay

## 2015-08-22 ENCOUNTER — Other Ambulatory Visit: Payer: Self-pay | Admitting: Family Medicine

## 2015-08-22 LAB — H. PYLORI BREATH TEST: H. PYLORI UBIT: POSITIVE — AB

## 2015-08-22 MED ORDER — OMEPRAZOLE 20 MG PO CPDR: 20.0000 mg | DELAYED_RELEASE_CAPSULE | Freq: Two times a day (BID) | ORAL | Status: DC

## 2015-08-22 MED ORDER — DOXYCYCLINE HYCLATE 100 MG PO TABS: 100.0000 mg | ORAL_TABLET | Freq: Two times a day (BID) | ORAL | Status: AC

## 2015-08-22 MED ORDER — METRONIDAZOLE 250 MG PO TABS: 250.0000 mg | ORAL_TABLET | Freq: Four times a day (QID) | ORAL | Status: DC

## 2015-08-22 MED ORDER — TETRACYCLINE HCL 500 MG PO CAPS: 500.0000 mg | ORAL_CAPSULE | Freq: Four times a day (QID) | ORAL | Status: DC

## 2015-08-22 NOTE — Telephone Encounter (Signed)
-----   Message from Malva Limesonald E Fisher, MD sent at 08/22/2015  7:56 AM EDT ----- H. pylori is still present. Need to start metronidazole 250mg  four times daily x 10 days, tetracycline 500mg  four times daily for 10 days. omeprazole 20mg  twice a day for 10 days, and Pepto-bismol 2 tablets four times daily

## 2015-08-22 NOTE — Telephone Encounter (Signed)
Patient advised as directed below. Patient verbalized understanding. [facility].

## 2015-08-22 NOTE — Progress Notes (Unsigned)
Tetracycline changed to doxycycline since tetracycline not covered by insurance.

## 2015-08-23 ENCOUNTER — Telehealth: Payer: Self-pay | Admitting: Family Medicine

## 2015-08-23 NOTE — Telephone Encounter (Signed)
Pt stated that her insurance won't cover doxycycline (VIBRA-TABS) 100 MG tablet. Pt wanted to see if something else could be sent to [facility] [address] Please advise. Thanks TNP

## 2015-08-24 NOTE — Telephone Encounter (Signed)
Pt is returning call/MW °

## 2015-08-24 NOTE — Telephone Encounter (Signed)
Patient called back to say that the pharmacist was unsure of which medication she did not have filled. I asked the patient to read the bottles she did pick up at the pharmacy. Patient reports that she has Omeprazole, Metronidazole, and Doxycycline. Is patient supposed to be taking another medication as well? Please advise. Thanks!

## 2015-08-24 NOTE — Telephone Encounter (Signed)
Asked patient if she already had Doxy and she reports that she was unsure of the name of the medication that she left at the pharmacy. I advised her that the pharmacy said that she has already picked up Doxy. Patient reports that she will call pharmacy to see which medication that is not covered through her insurance. She will give us a call back to confirm.

## 2015-08-24 NOTE — Telephone Encounter (Signed)
Pharmacist stated that pt picked-up the doxycycline on the 26th, along with the other meds prescribed. Called pt back. Left message with her sister to return call.

## 2015-08-24 NOTE — Telephone Encounter (Signed)
Pt called to get an update on the medication. Thanks TNP

## 2015-08-24 NOTE — Telephone Encounter (Signed)
Returned call, LMOVM to cb.

## 2015-08-24 NOTE — Telephone Encounter (Signed)
Please call pharmacy and see if there is another formulation of doxycycline that is less expensive, or covered by her insurance. Thanks.

## 2015-08-27 ENCOUNTER — Telehealth: Payer: Self-pay

## 2015-08-27 NOTE — Telephone Encounter (Signed)
LMOVM for callback.

## 2015-08-27 NOTE — Telephone Encounter (Signed)
Patient called back and was advised as below. Patient states she has the OTC Pepto Bismol  as well as the Omeprazole, Metronidazole and Doxycycline but hasn't started any of them yet.  Patient wanted to know if this is the right regiment to treat H. Pylori since she didn't get the other medication that was prescribed (Tetracycline).  I reviewed patient chart and saw that Tetracycline was initially prescribed but was changed to Doxycyline because of patient insurance not covering the Tetracycline. I called patient back and left message on her voice mail explaining this to her. Patient requested that I leave a detailed message if she didn't pick up the phone. Patient should be taking 4 medications which are Doxycyline, OTC Pepto Bismol, Metronidazole and Omeprazole. I stated on the voice message that if she has any further questions she should call back and ask to speak with Fisher's nurse.

## 2015-08-27 NOTE — Telephone Encounter (Signed)
She is supposed to be taking OTC Pepto-bismol 2 tablets four times daily

## 2015-08-27 NOTE — Telephone Encounter (Signed)
Patient called back to see if she was supposed to be taking something else in addition to Omeprazole, Metronidazole, and Doxycycline? Patient is currently being treated for a H. Pylori infection. Patient reports that it was one medication that was not covered through her insurance, but she is unaware of the name. She was unable to afford it, so she did not have it filled. Are the medications listed above an effective treatment for H.Pylori? Please advise. Thanks!

## 2015-09-14 ENCOUNTER — Encounter: Payer: Self-pay | Admitting: Family Medicine

## 2015-09-14 ENCOUNTER — Ambulatory Visit (INDEPENDENT_AMBULATORY_CARE_PROVIDER_SITE_OTHER): Payer: Medicare Other | Admitting: Family Medicine

## 2015-09-14 VITALS — BP 130/88 | HR 92 | Temp 98.1°F | Resp 16 | Wt 290.0 lb

## 2015-09-14 DIAGNOSIS — K2970 Gastritis, unspecified, without bleeding: Secondary | ICD-10-CM

## 2015-09-14 DIAGNOSIS — N760 Acute vaginitis: Secondary | ICD-10-CM

## 2015-09-14 DIAGNOSIS — E119 Type 2 diabetes mellitus without complications: Secondary | ICD-10-CM

## 2015-09-14 DIAGNOSIS — I10 Essential (primary) hypertension: Secondary | ICD-10-CM

## 2015-09-14 MED ORDER — FLUCONAZOLE 150 MG PO TABS: ORAL_TABLET | ORAL | Status: DC

## 2015-09-14 NOTE — Progress Notes (Signed)
Patient: Casey Reese Female    DOB: Oct 17, 1990   25 y.o.   MRN: [id number] Visit Date: 09/14/2015  Today's Provider: Mila Merryonald Cal Gindlesperger, MD   Chief Complaint  Patient presents with  . Follow-up  . Hypertension   Subjective:    HPI  Follow-up for H. Pylori from 08/20/2015; lab test still showing positive for H. Pylori. Patient treated with metronidazole, omeprazole, doxycycline and Pepto bismol.  States pain is greatly improved, although she still gets a bit of a stomach cramp for a few minutes after eating.      Hypertension, follow-up:  BP Readings from Last 3 Encounters:  09/14/15 130/88  08/20/15 130/90  07/27/15 135/78    She was last seen for hypertension 3 weeks ago.  BP at that visit was 150/110. Management since that visit includes; started diltiazem 120 mg qd.She reports good compliance with treatment. She is not having side effects. none  She is not exercising. She is not adherent to low salt diet.   Outside blood pressures are 140/88. She is experiencing none.  Patient denies none.   Cardiovascular risk factors include none.  Use of agents associated with hypertension: none.   ----------------------------------------------------------------------  Patient thinks she may have a yeast infection from antibiotics. Has symptoms of itching, irritation and burning. Has taken otc suppository for yeast infection but it is not helping.     Allergies  Allergen Reactions  . Sulfa Antibiotics    Previous Medications   ALBUTEROL (PROVENTIL HFA) 108 (90 BASE) MCG/ACT INHALER    Inhale 2 puffs into the lungs every 6 (six) hours as needed.   AMLODIPINE (NORVASC) 5 MG TABLET    TK 1 T PO D   DILTIAZEM (CARDIZEM CD) 120 MG 24 HR CAPSULE    Take 1 capsule (120 mg total) by mouth daily.   MECLIZINE (ANTIVERT) 25 MG TABLET    Take 1 tablet (25 mg total) by mouth 3 (three) times daily as needed for dizziness.   METFORMIN (GLUCOPHAGE XR) 500 MG 24 HR TABLET    Take  1 tablet (500 mg total) by mouth daily with breakfast. For diabetes   METRONIDAZOLE (FLAGYL) 250 MG TABLET    Take 1 tablet (250 mg total) by mouth 4 (four) times daily. For 10 days   OMEPRAZOLE (PRILOSEC) 20 MG CAPSULE    Take 1 capsule (20 mg total) by mouth 2 (two) times daily. For 10 days   OXYCODONE-ACETAMINOPHEN (ROXICET) 5-325 MG TABLET    Take 1 tablet by mouth every 4 (four) hours as needed for severe pain.   RANITIDINE (ZANTAC) 150 MG TABLET    Take 1 tablet (150 mg total) by mouth 2 (two) times daily.    Review of Systems  Constitutional: Negative for fever, chills, appetite change and fatigue.  Respiratory: Negative for chest tightness and shortness of breath.   Cardiovascular: Negative for chest pain and palpitations.  Gastrointestinal: Negative for nausea, vomiting and abdominal pain.  Genitourinary: Positive for vaginal discharge.  Neurological: Negative for dizziness and weakness.    Social History  Substance Use Topics  . Smoking status: Current Every Day Smoker    Types: Cigarettes  . Smokeless tobacco: Not on file  . Alcohol Use: 0.0 oz/week    0 Standard drinks or equivalent per week     Comment: occasional   Objective:   BP 130/88 mmHg  Pulse 92  Temp(Src) 98.1 F (36.7 C) (Oral)  Resp 16  Wt 290 lb (  131.543 kg)  SpO2 98%  LMP 06/26/2015  Physical Exam   General Appearance:    Alert, cooperative, no distress  Eyes:    PERRL, conjunctiva/corneas clear, EOM's intact       Lungs:     Clear to auscultation bilaterally, respirations unlabored  Heart:    Regular rate and rhythm  Neurologic:   Awake, alert, oriented x 3. No apparent focal neurological           defect.          Assessment & Plan:     1. Vaginitis  - fluconazole (DIFLUCAN) 150 MG tablet; One tablet now, repeat in 1 week.  Dispense: 2 tablet; Refill: 0  2. Essential hypertension Well controlled.  Continue current medications.    3. Type 2 diabetes mellitus without complication,  without long-term current use of insulin (HCC) Doing well with metformin. Check a1c at follow up in a month  4. Gastritis Symptoms nearly resolved. If not completely resolved at follow up with repeat TOC.       The entirety of the information documented in the History of Present Illness, Review of Systems and Physical Exam were personally obtained by me. Portions of this information were initially documented by Awilda Bill, CMA and reviewed by me for thoroughness and accuracy.    Mila Merry, MD  [facility]

## 2015-09-23 ENCOUNTER — Other Ambulatory Visit: Payer: Self-pay | Admitting: Family Medicine

## 2015-10-08 ENCOUNTER — Ambulatory Visit: Payer: Medicare Other | Admitting: Family Medicine

## 2015-10-11 ENCOUNTER — Encounter: Payer: Self-pay | Admitting: Family Medicine

## 2015-10-11 ENCOUNTER — Ambulatory Visit (INDEPENDENT_AMBULATORY_CARE_PROVIDER_SITE_OTHER): Payer: Medicare Other | Admitting: Family Medicine

## 2015-10-11 VITALS — BP 150/88 | HR 111 | Temp 98.9°F | Resp 16 | Ht 66.0 in | Wt 290.0 lb

## 2015-10-11 DIAGNOSIS — J321 Chronic frontal sinusitis: Secondary | ICD-10-CM | POA: Diagnosis not present

## 2015-10-11 MED ORDER — AMOXICILLIN 500 MG PO CAPS: 1000.0000 mg | ORAL_CAPSULE | Freq: Two times a day (BID) | ORAL | Status: AC

## 2015-10-11 NOTE — Progress Notes (Signed)
Patient: Casey Reese Female    DOB: 08-26-1990   25 y.o.   MRN: [id number] Visit Date: 10/11/2015  Today's Provider: Mila Merryonald Mariselda Badalamenti, MD   Chief Complaint  Patient presents with  . Sinusitis   Subjective:    Sinusitis This is a new problem. The current episode started yesterday. The problem has been gradually worsening since onset. There has been no fever. Associated symptoms include chills, congestion, headaches, a hoarse voice, neck pain, sinus pressure, sneezing, a sore throat and swollen glands. Pertinent negatives include no coughing, diaphoresis, ear pain or shortness of breath. Past treatments include nothing.    Sinus pressure and sore throat started yesterday morning. Symptoms of headache, facial pressure, sore throat, and neck pain. Has not taken anything for symptoms. No fever.    Allergies  Allergen Reactions  . Sulfa Antibiotics    Current Meds  Medication Sig  . albuterol (PROVENTIL HFA) 108 (90 Base) MCG/ACT inhaler Inhale 2 puffs into the lungs every 6 (six) hours as needed.  Marland Kitchen. amLODipine (NORVASC) 5 MG tablet TK 1 T PO D  . CARTIA XT 120 MG 24 hr capsule TAKE 1 CAPSULE(120 MG) BY MOUTH DAILY  . meclizine (ANTIVERT) 25 MG tablet Take 1 tablet (25 mg total) by mouth 3 (three) times daily as needed for dizziness.  . metFORMIN (GLUCOPHAGE XR) 500 MG 24 hr tablet Take 1 tablet (500 mg total) by mouth daily with breakfast. For diabetes  . oxyCODONE-acetaminophen (ROXICET) 5-325 MG tablet Take 1 tablet by mouth every 4 (four) hours as needed for severe pain.  . ranitidine (ZANTAC) 150 MG tablet Take 1 tablet (150 mg total) by mouth 2 (two) times daily. (Patient taking differently: Take 150 mg by mouth 2 (two) times daily. Not yet filled)    Review of Systems  Constitutional: Positive for chills. Negative for fever, diaphoresis, appetite change and fatigue.  HENT: Positive for congestion, hoarse voice, sinus pressure, sneezing and sore throat. Negative for  ear discharge and ear pain.   Respiratory: Negative for cough, chest tightness and shortness of breath.   Cardiovascular: Negative for chest pain and palpitations.  Gastrointestinal: Negative for nausea, vomiting and abdominal pain.  Musculoskeletal: Positive for neck pain.  Neurological: Positive for headaches. Negative for dizziness and weakness.    Social History  Substance Use Topics  . Smoking status: Current Every Day Smoker    Types: Cigarettes  . Smokeless tobacco: Not on file  . Alcohol Use: 0.0 oz/week    0 Standard drinks or equivalent per week     Comment: occasional   Objective:   BP 150/88 mmHg  Pulse 111  Temp(Src) 98.9 F (37.2 C) (Oral)  Resp 16  Ht 5\' 6"  (1.676 m)  Wt 290 lb (131.543 kg)  BMI 46.83 kg/m2  SpO2 97%  LMP 06/26/2015  Physical Exam  General Appearance:    Alert, cooperative, no distress, obese  HENT:   neck without nodes, frontal  sinus tender and nasal mucosa pale and congested  Eyes:    PERRL, conjunctiva/corneas clear, EOM's intact       Lungs:     Clear to auscultation bilaterally, respirations unlabored  Heart:    Regular rate and rhythm  Neurologic:   Awake, alert, oriented x 3. No apparent focal neurological           defect.            Assessment & Plan:     1. Chronic frontal  sinusitis  - amoxicillin (AMOXIL) 500 MG capsule; Take 2 capsules (1,000 mg total) by mouth 2 (two) times daily.  Dispense: 40 capsule; Refill: 0     The entirety of the information documented in the History of Present Illness, Review of Systems and Physical Exam were personally obtained by me. Portions of this information were initially documented by April M. Hyacinth Meeker, CMA and reviewed by me for thoroughness and accuracy.    Mila Merry, MD  [facility]

## 2015-10-25 ENCOUNTER — Ambulatory Visit: Payer: Medicare Other | Admitting: Family Medicine

## 2015-10-26 ENCOUNTER — Other Ambulatory Visit: Payer: Self-pay | Admitting: Family Medicine

## 2015-10-26 ENCOUNTER — Ambulatory Visit (INDEPENDENT_AMBULATORY_CARE_PROVIDER_SITE_OTHER): Payer: Medicare Other | Admitting: Family Medicine

## 2015-10-26 ENCOUNTER — Encounter: Payer: Self-pay | Admitting: Family Medicine

## 2015-10-26 VITALS — BP 150/90 | HR 91 | Temp 98.4°F | Resp 16 | Wt 293.0 lb

## 2015-10-26 DIAGNOSIS — E119 Type 2 diabetes mellitus without complications: Secondary | ICD-10-CM

## 2015-10-26 DIAGNOSIS — R1013 Epigastric pain: Secondary | ICD-10-CM

## 2015-10-26 MED ORDER — RANITIDINE HCL 150 MG PO TABS
150.0000 mg | ORAL_TABLET | Freq: Two times a day (BID) | ORAL | Status: DC
Start: 2015-10-26 — End: 2015-11-01

## 2015-10-26 MED ORDER — SUCRALFATE 1 G PO TABS: 1.0000 g | ORAL_TABLET | Freq: Three times a day (TID) | ORAL | Status: DC

## 2015-10-26 MED ORDER — METFORMIN HCL ER 500 MG PO TB24: 500.0000 mg | ORAL_TABLET | Freq: Every day | ORAL | Status: DC

## 2015-10-26 NOTE — Patient Instructions (Signed)
We will call you with the lab results. 

## 2015-10-26 NOTE — Progress Notes (Signed)
Subjective:     Patient ID: Casey Reese, female   DOB: 05/25/1990, 25 y.o.   MRN: [id number]  HPI  Chief Complaint  Patient presents with  . Abdominal Pain  States over the last 4-5 days she has developed abdominal pain in association with nausea. Denies vomiting, diarrhea, fever or chills. States she had been taking Zantac as needed and took her last one yesterday. Has previously been treated for H.Pylori gastritis on two occasions and states this feels the same. No alcohol use at this time. Wishes refill on metformin.   Review of Systems     Objective:   Physical Exam  Constitutional: She appears well-developed and well-nourished. No distress.  Abdominal: Soft. There is tenderness (moderate tenderness in epigatric area). There is no guarding.       Assessment:    1. Epigastric pain - Lipase - H. pylori breath test - CBC with Differential/Platelet - ranitidine (ZANTAC) 150 MG tablet; Take 1 tablet (150 mg total) by mouth 2 (two) times daily.  Dispense: 60 tablet; Refill: 0 - sucralfate (CARAFATE) 1 g tablet; Take 1 tablet (1 g total) by mouth 4 (four) times daily -  with meals and at bedtime.  Dispense: 28 tablet; Refill: 1  2. Type 2 diabetes mellitus without complication, without long-term current use of insulin (HCC) - metFORMIN (GLUCOPHAGE XR) 500 MG 24 hr tablet; Take 1 tablet (500 mg total) by mouth daily with breakfast. For diabetes  Dispense: 30 tablet; Refill: 1    Plan:    Further f/u pending lab work.

## 2015-10-30 LAB — CBC WITH DIFFERENTIAL/PLATELET
BASOS ABS: 0 10*3/uL (ref 0.0–0.2)
BASOS: 0 %
EOS (ABSOLUTE): 0.3 10*3/uL (ref 0.0–0.4)
Eos: 4 %
Hematocrit: 37.3 % (ref 34.0–46.6)
Hemoglobin: 12.1 g/dL (ref 11.1–15.9)
IMMATURE GRANULOCYTES: 0 %
Immature Grans (Abs): 0 10*3/uL (ref 0.0–0.1)
LYMPHS: 29 %
Lymphocytes Absolute: 2.6 10*3/uL (ref 0.7–3.1)
MCH: 25.9 pg — ABNORMAL LOW (ref 26.6–33.0)
MCHC: 32.4 g/dL (ref 31.5–35.7)
MCV: 80 fL (ref 79–97)
MONOS ABS: 0.6 10*3/uL (ref 0.1–0.9)
Monocytes: 6 %
NEUTROS PCT: 61 %
Neutrophils Absolute: 5.6 10*3/uL (ref 1.4–7.0)
PLATELETS: 398 10*3/uL — AB (ref 150–379)
RBC: 4.68 x10E6/uL (ref 3.77–5.28)
RDW: 15 % (ref 12.3–15.4)
WBC: 9.2 10*3/uL (ref 3.4–10.8)

## 2015-10-30 LAB — LIPASE: Lipase: 20 U/L (ref 0–59)

## 2015-10-30 LAB — H. PYLORI BREATH TEST: H. PYLORI UBIT: NEGATIVE

## 2015-10-31 ENCOUNTER — Telehealth: Payer: Self-pay

## 2015-10-31 NOTE — Telephone Encounter (Signed)
-----   Message from Anola Gurneyobert Chauvin, [place] sent at 10/31/2015  7:49 AM EDT ----- Test for helicobactor pylori ok. Is she feeling better on current medication?

## 2015-10-31 NOTE — Telephone Encounter (Signed)
LMTCB. sd  

## 2015-10-31 NOTE — Telephone Encounter (Signed)
-----   Message from Anola Gurneyobert [place]auvin, [place] sent at 10/29/2015  4:31 PM EDT ----- Let her know her pancreas was ok and blood count showed no anemia or white cell infection due to infection. We are still waiting for the H. Pylori result.

## 2015-11-01 ENCOUNTER — Other Ambulatory Visit: Payer: Self-pay | Admitting: Family Medicine

## 2015-11-01 DIAGNOSIS — R1013 Epigastric pain: Secondary | ICD-10-CM

## 2015-11-01 MED ORDER — RANITIDINE HCL 150 MG PO TABS: 150.0000 mg | ORAL_TABLET | Freq: Two times a day (BID) | ORAL | Status: DC

## 2015-11-01 NOTE — Telephone Encounter (Signed)
Patient advised as below. Patient reports she is feeling a little better still has abdominal pain on and off. Patient denies diarrhea or vomiting. Patient requesting Zantac reports that her pharmacy did not receive prescription. Patient reports that she would like to try Zantac for symptoms. sd

## 2015-11-01 NOTE — Telephone Encounter (Signed)
Zantac sent in again.

## 2015-11-06 ENCOUNTER — Ambulatory Visit: Payer: Medicare Other | Admitting: Family Medicine

## 2015-11-07 ENCOUNTER — Encounter: Payer: Self-pay | Admitting: Emergency Medicine

## 2015-11-07 ENCOUNTER — Emergency Department
Admission: EM | Admit: 2015-11-07 | Discharge: 2015-11-07 | Disposition: A | Discharge status: Home / Self Care | Payer: Medicare Other | Attending: Emergency Medicine | Admitting: Emergency Medicine

## 2015-11-07 DIAGNOSIS — Z7984 Long term (current) use of oral hypoglycemic drugs: Secondary | ICD-10-CM | POA: Diagnosis not present

## 2015-11-07 DIAGNOSIS — F17210 Nicotine dependence, cigarettes, uncomplicated: Secondary | ICD-10-CM | POA: Diagnosis not present

## 2015-11-07 DIAGNOSIS — R1013 Epigastric pain: Secondary | ICD-10-CM

## 2015-11-07 DIAGNOSIS — Z79899 Other long term (current) drug therapy: Secondary | ICD-10-CM | POA: Insufficient documentation

## 2015-11-07 DIAGNOSIS — R1012 Left upper quadrant pain: Secondary | ICD-10-CM | POA: Diagnosis not present

## 2015-11-07 DIAGNOSIS — I10 Essential (primary) hypertension: Secondary | ICD-10-CM | POA: Diagnosis not present

## 2015-11-07 DIAGNOSIS — K2970 Gastritis, unspecified, without bleeding: Secondary | ICD-10-CM | POA: Diagnosis not present

## 2015-11-07 DIAGNOSIS — E119 Type 2 diabetes mellitus without complications: Secondary | ICD-10-CM | POA: Diagnosis not present

## 2015-11-07 HISTORY — DX: Essential (primary) hypertension: I10

## 2015-11-07 LAB — COMPREHENSIVE METABOLIC PANEL
ALK PHOS: 97 U/L (ref 38–126)
ALT: 17 U/L (ref 14–54)
ANION GAP: 8 (ref 5–15)
AST: 20 U/L (ref 15–41)
Albumin: 4.4 g/dL (ref 3.5–5.0)
BILIRUBIN TOTAL: 0.4 mg/dL (ref 0.3–1.2)
BUN: 11 mg/dL (ref 6–20)
CALCIUM: 9.4 mg/dL (ref 8.9–10.3)
CO2: 26 mmol/L (ref 22–32)
Chloride: 101 mmol/L (ref 101–111)
Creatinine, Ser: 0.65 mg/dL (ref 0.44–1.00)
GFR calc Af Amer: >60 mL/min (ref >60)
GLUCOSE: 129 mg/dL — AB (ref 65–99)
POTASSIUM: 3.8 mmol/L (ref 3.5–5.1)
Sodium: 135 mmol/L (ref 135–145)
TOTAL PROTEIN: 8 g/dL (ref 6.5–8.1)

## 2015-11-07 LAB — CBC
HEMATOCRIT: 39.7 % (ref 35.0–47.0)
HEMOGLOBIN: 13.1 g/dL (ref 12.0–16.0)
MCH: 26.4 pg (ref 26.0–34.0)
MCHC: 33 g/dL (ref 32.0–36.0)
MCV: 80.1 fL (ref 80.0–100.0)
Platelets: 358 10*3/uL (ref 150–440)
RBC: 4.95 MIL/uL (ref 3.80–5.20)
RDW: 15 % — AB (ref 11.5–14.5)
WBC: 9.9 10*3/uL (ref 3.6–11.0)

## 2015-11-07 LAB — URINALYSIS COMPLETE WITH MICROSCOPIC (ARMC ONLY)
Bilirubin Urine: NEGATIVE
GLUCOSE, UA: NEGATIVE mg/dL
KETONES UR: NEGATIVE mg/dL
NITRITE: NEGATIVE
Protein, ur: NEGATIVE mg/dL
SPECIFIC GRAVITY, URINE: 1.021 (ref 1.005–1.030)
pH: 6 (ref 5.0–8.0)

## 2015-11-07 LAB — LIPASE, BLOOD: Lipase: 20 U/L (ref 11–51)

## 2015-11-07 LAB — POCT PREGNANCY, URINE: Preg Test, Ur: NEGATIVE

## 2015-11-07 MED ORDER — FAMOTIDINE 20 MG PO TABS
40.0000 mg | ORAL_TABLET | Freq: Once | ORAL | Status: AC
  Administered 2015-11-07: 40 mg via ORAL
  Filled 2015-11-07: qty 2

## 2015-11-07 MED ORDER — ONDANSETRON 4 MG PO TBDP
8.0000 mg | ORAL_TABLET | Freq: Once | ORAL | Status: AC
  Administered 2015-11-07: 8 mg via ORAL
  Filled 2015-11-07: qty 2

## 2015-11-07 MED ORDER — GI COCKTAIL ~~LOC~~
30.0000 mL | ORAL | Status: AC
  Administered 2015-11-07: 30 mL via ORAL
  Filled 2015-11-07: qty 30

## 2015-11-07 MED ORDER — SUCRALFATE 1 G PO TABS: ORAL_TABLET | ORAL | Status: DC

## 2015-11-07 MED ORDER — RANITIDINE HCL 150 MG PO TABS: 300.0000 mg | ORAL_TABLET | Freq: Two times a day (BID) | ORAL | Status: DC

## 2015-11-07 NOTE — Discharge Instructions (Signed)
Abdominal Pain, Adult °Many things can cause abdominal pain. Usually, abdominal pain is not caused by a disease and will improve without treatment. It can often be observed and treated at home. Your health care provider will do a physical exam and possibly order blood tests and X-rays to help determine the seriousness of your pain. However, in many cases, more time must pass before a clear cause of the pain can be found. Before that point, your health care provider may not know if you need more testing or further treatment. °HOME CARE INSTRUCTIONS °Monitor your abdominal pain for any changes. The following actions may help to alleviate any discomfort you are experiencing: °· Only take over-the-counter or prescription medicines as directed by your health care provider. °· Do not take laxatives unless directed to do so by your health care provider. °· Try a clear liquid diet (broth, tea, or water) as directed by your health care provider. Slowly move to a bland diet as tolerated. °SEEK MEDICAL CARE IF: °· You have unexplained abdominal pain. °· You have abdominal pain associated with nausea or diarrhea. °· You have pain when you urinate or have a bowel movement. °· You experience abdominal pain that wakes you in the night. °· You have abdominal pain that is worsened or improved by eating food. °· You have abdominal pain that is worsened with eating fatty foods. °· You have a fever. °SEEK IMMEDIATE MEDICAL CARE IF: °· Your pain does not go away within 2 hours. °· You keep throwing up (vomiting). °· Your pain is felt only in portions of the abdomen, such as the right side or the left lower portion of the abdomen. °· You pass bloody or black tarry stools. °MAKE SURE YOU: °· Understand these instructions. °· Will watch your condition. °· Will get help right away if you are not doing well or get worse. °  °This information is not intended to replace advice given to you by your health care provider. Make sure you discuss  any questions you have with your health care provider. °  °Document Released: 01/22/2005 Document Revised: 01/03/2015 Document Reviewed: 12/22/2012 °Elsevier Interactive Patient Education ©2016 [employer] ° °Gastritis, Adult °Gastritis is soreness and swelling (inflammation) of the lining of the stomach. Gastritis can develop as a sudden onset (acute) or long-term (chronic) condition. If gastritis is not treated, it can lead to stomach bleeding and ulcers. °CAUSES  °Gastritis occurs when the stomach lining is weak or damaged. Digestive juices from the stomach then inflame the weakened stomach lining. The stomach lining may be weak or damaged due to viral or bacterial infections. One common bacterial infection is the Helicobacter pylori infection. Gastritis can also result from excessive alcohol consumption, taking certain medicines, or having too much acid in the stomach.  °SYMPTOMS  °In some cases, there are no symptoms. When symptoms are present, they may include: °· Pain or a burning sensation in the upper abdomen. °· Nausea. °· Vomiting. °· An uncomfortable feeling of fullness after eating. °DIAGNOSIS  °Your caregiver may suspect you have gastritis based on your symptoms and a physical exam. To determine the cause of your gastritis, your caregiver may perform the following: °· Blood or stool tests to check for the H pylori bacterium. °· Gastroscopy. A thin, flexible tube (endoscope) is passed down the esophagus and into the stomach. The endoscope has a light and camera on the end. Your caregiver uses the endoscope to view the inside of the stomach. °· Taking a tissue sample (  biopsy) from the stomach to examine under a microscope. °TREATMENT  °Depending on the cause of your gastritis, medicines may be prescribed. If you have a bacterial infection, such as an H pylori infection, antibiotics may be given. If your gastritis is caused by too much acid in the stomach, H2 blockers or antacids may be given. Your  caregiver may recommend that you stop taking aspirin, ibuprofen, or other nonsteroidal anti-inflammatory drugs (NSAIDs). °HOME CARE INSTRUCTIONS °· Only take over-the-counter or prescription medicines as directed by your caregiver. °· If you were given antibiotic medicines, take them as directed. Finish them even if you start to feel better. °· Drink enough fluids to keep your urine clear or pale yellow. °· Avoid foods and drinks that make your symptoms worse, such as: °¨ Caffeine or alcoholic drinks. °¨ Chocolate. °¨ Peppermint or mint flavorings. °¨ Garlic and onions. °¨ Spicy foods. °¨ Citrus fruits, such as oranges, lemons, or limes. °¨ Tomato-based foods such as sauce, chili, salsa, and pizza. °¨ Fried and fatty foods. °· Eat small, frequent meals instead of large meals. °SEEK IMMEDIATE MEDICAL CARE IF:  °· You have black or dark red stools. °· You vomit blood or material that looks like coffee grounds. °· You are unable to keep fluids down. °· Your abdominal pain gets worse. °· You have a fever. °· You do not feel better after 1 week. °· You have any other questions or concerns. °MAKE SURE YOU: °· Understand these instructions. °· Will watch your condition. °· Will get help right away if you are not doing well or get worse. °  °This information is not intended to replace advice given to you by your health care provider. Make sure you discuss any questions you have with your health care provider. °  °Document Released: 04/08/2001 Document Revised: 10/14/2011 Document Reviewed: 05/28/2011 °Elsevier Interactive Patient Education ©2016 Elsevier Inc. ° °

## 2015-11-07 NOTE — ED Provider Notes (Signed)
[facility] Emergency Department Provider Note  ____________________________________________  Time seen: 9:30 PM  I have reviewed the triage vital signs and the nursing notes.   HISTORY  Chief Complaint Abdominal Pain    HPI Casey Reese is a 25 y.o. female who complains of left upper quadrant abdominal pain for about a week, worse with eating. Nonradiating. No back pain. No dysuria frequency urgency. He is currently on her menstrual cycle and unable to tell if she is having any hematuria but does not think so. No fevers or chills. She recently was treated for H. pylori, and follow-up with her primary care doctor showed that the H. pylori infection had been eradicated. Has previously been told that she has gastritis, but has not picked up prescriptions for Carafate and Zantac that she was given.     Past Medical History  Diagnosis Date  . S/P above knee amputation (HCC)   . History of chicken pox   . Diabetes mellitus without complication (HCC)   . 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 Hypertension      Patient Active Problem List   Diagnosis Date Noted  . Helicobacter pylori gastritis 07/09/2015  . Essential hypertension 06/29/2015  . Diabetes mellitus (HCC) 06/04/2015  . Morbid obesity (HCC) 01/18/2008  . 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 Above knee leg amputation Left 2003     Current Outpatient Rx  Name  Route  Sig  Dispense  Refill  . albuterol (PROVENTIL HFA) 108 (90 Base) MCG/ACT inhaler   Inhalation   Inhale 2 puffs into the lungs every 6 (six) hours as needed.   18 g   3   . CARTIA XT 120 MG 24 hr capsule      TAKE 1 CAPSULE(120 MG) BY MOUTH DAILY   90 capsule   3     **Patient requests 90 days supply**   . meclizine (ANTIVERT) 25 MG tablet   Oral   Take 1 tablet (25 mg total) by mouth 3 (three) times daily as needed for  dizziness.   30 tablet   0   . metFORMIN (GLUCOPHAGE-XR) 500 MG 24 hr tablet      TAKE 1 TABLET(500 MG) BY MOUTH DAILY WITH BREAKFAST FOR DIABETES   90 tablet   0     **Patient requests 90 days supply**   . oxyCODONE-acetaminophen (ROXICET) 5-325 MG tablet   Oral   Take 1 tablet by mouth every 4 (four) hours as needed for severe pain.   10 tablet   0   . ranitidine (ZANTAC) 150 MG tablet   Oral   Take 2 tablets (300 mg total) by mouth 2 (two) times daily.   60 tablet   0   . sucralfate (CARAFATE) 1 g tablet      TAKE 1 TABLET(1 GRAM) BY MOUTH FOUR TIMES DAILY AT BEDTIME WITH MEALS   28 tablet   1     This is for short term use      Allergies Sulfa antibiotics   Family History  Problem Relation Age of Onset  . Diabetes Mother     type 2    Social History Social History  Substance Use Topics  . Smoking status: Current Some Day Smoker    Types: Cigarettes  . Smokeless tobacco: None  . Alcohol Use: 0.0 oz/week    0 Standard drinks or equivalent per  week     Comment: occasional    Review of Systems  Constitutional:   No fever or chills.  ENT:   No sore throat. No rhinorrhea. Cardiovascular:   No chest pain. Respiratory:   No dyspnea or cough. Gastrointestinal:   Positive for abdominal pain as above, occasional vomiting. No diarrhea. Normal bowel movements..  Genitourinary:   Negative for dysuria or difficulty urinating. Musculoskeletal:   Negative for focal pain or swelling Neurological:   Negative for headaches 10-point ROS otherwise negative.  ____________________________________________   PHYSICAL EXAM:  VITAL SIGNS: ED Triage Vitals  Enc Vitals Group     BP 11/07/15 1946 196/111 mmHg     Pulse Rate 11/07/15 1946 77     Resp 11/07/15 1946 18     Temp 11/07/15 1946 98.6 F (37 C)     Temp Source 11/07/15 1946 Oral     SpO2 11/07/15 1946 100 %     Weight 11/07/15 1946 280 lb (127.007 kg)     Height 11/07/15 1946 5\' 6"  (1.676 m)     Head  Cir --      Peak Flow --      Pain Score 11/07/15 1947 10     Pain Loc --      Pain Edu? --      Excl. in GC? --     Vital signs reviewed, nursing assessments reviewed.   Constitutional:   Alert and oriented. Well appearing and in no distress. Eyes:   No scleral icterus. No conjunctival pallor. PERRL. EOMI.  No nystagmus. ENT   Head:   Normocephalic and atraumatic.   Nose:   No congestion/rhinnorhea. No septal hematoma   Mouth/Throat:   MMM, no pharyngeal erythema. No peritonsillar mass.    Neck:   No stridor. No SubQ emphysema. No meningismus. Hematological/Lymphatic/Immunilogical:   No cervical lymphadenopathy. Cardiovascular:   RRR. Symmetric bilateral radial and DP pulses.  No murmurs.  Respiratory:   Normal respiratory effort without tachypnea nor retractions. Breath sounds are clear and equal bilaterally. No wheezes/rales/rhonchi. Gastrointestinal:   Soft with left upper quadrant tenderness, mild. Non distended. There is no CVA tenderness.  No rebound, rigidity, or guarding. Genitourinary:   deferred Musculoskeletal:   Nontender with normal range of motion in all extremities. No joint effusions.  No lower extremity tenderness.  No edema. Status post left AKA. Neurologic:   Normal speech and language.  CN 2-10 normal. Motor grossly intact. No gross focal neurologic deficits are appreciated.  Skin:    Skin is warm, dry and intact. No rash noted.  No petechiae, purpura, or bullae.  ____________________________________________    LABS (pertinent positives/negatives) (all labs ordered are listed, but only abnormal results are displayed) Labs Reviewed  COMPREHENSIVE METABOLIC PANEL - Abnormal; Notable for the following:    Glucose, Bld 129 (*)    All other components within normal limits  CBC - Abnormal; Notable for the following:    RDW 15.0 (*)    All other components within normal limits  URINALYSIS COMPLETEWITH MICROSCOPIC ([facility] ONLY) - Abnormal; Notable  for the following:    Color, Urine YELLOW (*)    APPearance CLEAR (*)    Hgb urine dipstick 3+ (*)    Leukocytes, UA 1+ (*)    Bacteria, UA RARE (*)    Squamous Epithelial / LPF 0-5 (*)    All other components within normal limits  URINE CULTURE  LIPASE, BLOOD  POC URINE PREG, ED  POCT PREGNANCY, URINE  ____________________________________________   EKG    ____________________________________________    RADIOLOGY    ____________________________________________   PROCEDURES   ____________________________________________   INITIAL IMPRESSION / ASSESSMENT AND PLAN / ED COURSE  Pertinent labs & imaging results that were available during my care of the patient were reviewed by me and considered in my medical decision making (see chart for details).  Patient well appearing no acute distress. Does have uncontrolled hypertension but otherwise vital signs are unremarkable. Urinalysis shows hematuria but this is likely artifactual given that she is currently menstruating. No significant amount of bacteria, nitrate negative, ketone negative. Other labs unremarkable. We'll give GI cocktail and Zantac and Carafate and Zofran, follow up with primary care. Patient strongly encouraged to take medications continuously for 2 weeks, and seek GI referral if symptoms have not resolved by then.Considering the patient's symptoms, medical history, and physical examination today, I have low suspicion for cholecystitis or biliary pathology, pancreatitis, perforation or bowel obstruction, hernia, intra-abdominal abscess, AAA or dissection, volvulus or intussusception, mesenteric ischemia, or appendicitis.  Patient advised to continue taking her other medications to manage her hypertension blood pressure, follow up with primary care for recheck of blood pressure.     ____________________________________________   FINAL CLINICAL IMPRESSION(S) / ED DIAGNOSES  Final diagnoses:  Gastritis   LUQ abdominal pain  Uncontrolled hypertension     Portions of this note were generated with dragon dictation software. Dictation errors may occur despite best attempts at proofreading.   Sharman Cheek, MD 11/07/15 2146

## 2015-11-07 NOTE — ED Notes (Signed)
Patient c/o left abdominal pain since Friday, pt reports "eating in general makes it worse." Pt denies urinary symptoms, vomiting. Pt reports felling like not emptying bowels completely, states pain decreases with BM. Pt alert and oriented x 4, no increased work in breathing noted

## 2015-11-08 ENCOUNTER — Telehealth: Payer: Self-pay | Admitting: Family Medicine

## 2015-11-08 NOTE — Telephone Encounter (Signed)
Please review. Thanks!  

## 2015-11-08 NOTE — Telephone Encounter (Signed)
Pt is wanting to schedule a PAP smear and BP F/U with Dr. Sherrie MustacheFisher. I wasn't sure if this would be a 15 minute visit or if it would require more time. Pt has had 4 " No Shows" since 06/2015. Please advise. Thanks TNP

## 2015-11-09 ENCOUNTER — Telehealth: Payer: Self-pay | Admitting: Family Medicine

## 2015-11-09 DIAGNOSIS — R1084 Generalized abdominal pain: Secondary | ICD-10-CM

## 2015-11-09 LAB — URINE CULTURE

## 2015-11-09 NOTE — Telephone Encounter (Signed)
Pt is scheduled for a CPE on August 2, but pt would like to know if she can get a referral [facility] a GI Dr [facility]. Pt states she is having issue's with her stomach. Thanks CC

## 2015-11-09 NOTE — Telephone Encounter (Signed)
OK to refer to GI for persistent abdominal pain.

## 2015-11-09 NOTE — Telephone Encounter (Signed)
Need to put in CPE slot. Thanks.

## 2015-11-09 NOTE — Telephone Encounter (Signed)
Called pt no answer. LMTCB on voicemail. Thanks TNP

## 2015-11-12 NOTE — Telephone Encounter (Signed)
Pt is scheduled for the F/U appt for BP and PAP Smear on 12/10/15. Thanks TNP

## 2015-11-14 DIAGNOSIS — K2900 Acute gastritis without bleeding: Secondary | ICD-10-CM | POA: Diagnosis not present

## 2015-11-14 DIAGNOSIS — R1013 Epigastric pain: Secondary | ICD-10-CM | POA: Diagnosis not present

## 2015-11-14 DIAGNOSIS — R1012 Left upper quadrant pain: Secondary | ICD-10-CM | POA: Diagnosis not present

## 2015-11-14 DIAGNOSIS — Z79899 Other long term (current) drug therapy: Secondary | ICD-10-CM | POA: Diagnosis not present

## 2015-11-14 DIAGNOSIS — R1011 Right upper quadrant pain: Secondary | ICD-10-CM | POA: Diagnosis not present

## 2015-11-14 DIAGNOSIS — K219 Gastro-esophageal reflux disease without esophagitis: Secondary | ICD-10-CM | POA: Diagnosis not present

## 2015-11-14 DIAGNOSIS — I10 Essential (primary) hypertension: Secondary | ICD-10-CM | POA: Diagnosis not present

## 2015-11-14 DIAGNOSIS — E119 Type 2 diabetes mellitus without complications: Secondary | ICD-10-CM | POA: Diagnosis not present

## 2015-11-14 DIAGNOSIS — K760 Fatty (change of) liver, not elsewhere classified: Secondary | ICD-10-CM | POA: Diagnosis not present

## 2015-11-14 DIAGNOSIS — R109 Unspecified abdominal pain: Secondary | ICD-10-CM | POA: Diagnosis not present

## 2015-11-14 DIAGNOSIS — Z888 Allergy status to other drugs, medicaments and biological substances status: Secondary | ICD-10-CM | POA: Diagnosis not present

## 2015-11-14 DIAGNOSIS — F17200 Nicotine dependence, unspecified, uncomplicated: Secondary | ICD-10-CM | POA: Diagnosis not present

## 2015-11-15 ENCOUNTER — Telehealth: Payer: Self-pay | Admitting: Family Medicine

## 2015-11-15 NOTE — Telephone Encounter (Signed)
LMOVM for pt to return call 

## 2015-11-15 NOTE — Telephone Encounter (Signed)
Add chlorthalidone 25mg  daily, #30, rf x 0 and follow up 3 weeks.

## 2015-11-15 NOTE — Telephone Encounter (Signed)
High BP 154/95 at ER yesterday and still having headaches.  She wants to know if she needs to have higher dose of meds.

## 2015-11-15 NOTE — Telephone Encounter (Signed)
Please advise 

## 2015-11-16 ENCOUNTER — Other Ambulatory Visit: Payer: Self-pay | Admitting: Family Medicine

## 2015-11-16 ENCOUNTER — Other Ambulatory Visit: Payer: Self-pay | Admitting: Emergency Medicine

## 2015-11-16 MED ORDER — CHLORTHALIDONE 25 MG PO TABS: 25.0000 mg | ORAL_TABLET | Freq: Every day | ORAL | Status: DC

## 2015-11-16 NOTE — Telephone Encounter (Signed)
Medication sent to pharmacy  Pt informed 

## 2015-11-16 NOTE — Telephone Encounter (Signed)
Pt returned call. Please call back.

## 2015-11-28 ENCOUNTER — Encounter: Payer: Medicare Other | Admitting: Family Medicine

## 2015-11-28 DIAGNOSIS — R875 Abnormal microbiological findings in specimens from female genital organs: Secondary | ICD-10-CM | POA: Diagnosis not present

## 2015-11-28 DIAGNOSIS — Z8619 Personal history of other infectious and parasitic diseases: Secondary | ICD-10-CM | POA: Diagnosis not present

## 2015-11-28 DIAGNOSIS — Z01419 Encounter for gynecological examination (general) (routine) without abnormal findings: Secondary | ICD-10-CM | POA: Diagnosis not present

## 2015-11-28 DIAGNOSIS — R1013 Epigastric pain: Secondary | ICD-10-CM | POA: Diagnosis not present

## 2015-12-10 ENCOUNTER — Ambulatory Visit: Payer: Medicare Other | Admitting: Family Medicine

## 2015-12-27 ENCOUNTER — Other Ambulatory Visit: Payer: Self-pay | Admitting: Family Medicine

## 2015-12-27 ENCOUNTER — Telehealth: Payer: Self-pay | Admitting: Family Medicine

## 2015-12-27 NOTE — Telephone Encounter (Signed)
error 

## 2016-01-04 ENCOUNTER — Other Ambulatory Visit: Payer: Self-pay | Admitting: Family Medicine

## 2016-01-25 ENCOUNTER — Encounter: Payer: Self-pay | Admitting: *Deleted

## 2016-01-28 ENCOUNTER — Ambulatory Visit
Admission: RE | Admit: 2016-01-28 | Payer: Medicare Other | Source: Ambulatory Visit | Admitting: Unknown Physician Specialty

## 2016-01-28 ENCOUNTER — Encounter: Admission: RE | Payer: Self-pay | Source: Ambulatory Visit

## 2016-01-28 SURGERY — ESOPHAGOGASTRODUODENOSCOPY (EGD) WITH PROPOFOL
Anesthesia: General

## 2016-02-04 ENCOUNTER — Telehealth: Payer: Self-pay | Admitting: Family Medicine

## 2016-02-04 ENCOUNTER — Ambulatory Visit: Payer: Self-pay | Admitting: Family Medicine

## 2016-02-04 NOTE — Telephone Encounter (Signed)
Please review. KW 

## 2016-02-04 NOTE — Telephone Encounter (Signed)
Pt contacted office for refill request on the following medications[facility] [address] [place]  ON#[phone]/[facility]B#[phone]/MW  sucralfate (CARAFATE) 1 g tablet  chlorthalidone (HYGROTON) 25 MG tablet

## 2016-02-05 ENCOUNTER — Other Ambulatory Visit: Payer: Self-pay | Admitting: Family Medicine

## 2016-02-08 ENCOUNTER — Other Ambulatory Visit: Payer: Self-pay | Admitting: Family Medicine

## 2016-02-08 MED ORDER — SUCRALFATE 1 G PO TABS: ORAL_TABLET | ORAL | 3 refills | Status: DC

## 2016-02-08 MED ORDER — SUCRALFATE 1 G PO TABS: ORAL_TABLET | ORAL | 1 refills | Status: DC

## 2016-02-08 MED ORDER — CHLORTHALIDONE 25 MG PO TABS: 25.0000 mg | ORAL_TABLET | Freq: Every day | ORAL | 5 refills | Status: DC

## 2016-03-05 ENCOUNTER — Telehealth: Payer: Self-pay | Admitting: Family Medicine

## 2016-03-05 MED ORDER — DILTIAZEM HCL ER COATED BEADS 120 MG PO CP24: ORAL_CAPSULE | ORAL | 3 refills | Status: DC

## 2016-03-05 NOTE — Telephone Encounter (Signed)
Last filled 02/05/16 please review. KW

## 2016-03-05 NOTE — Telephone Encounter (Signed)
Pt needs refill on her CArtia 120 mg.  She Press [facility] [address] [facility] [place].  Her call back is [phone]  Thanks, Barth Kirksteri

## 2016-03-10 ENCOUNTER — Ambulatory Visit: Payer: Medicare Other | Admitting: Family Medicine

## 2016-03-12 ENCOUNTER — Ambulatory Visit: Payer: Medicare Other | Admitting: Family Medicine

## 2016-03-17 ENCOUNTER — Encounter: Payer: Self-pay | Admitting: Family Medicine

## 2016-03-17 ENCOUNTER — Ambulatory Visit (INDEPENDENT_AMBULATORY_CARE_PROVIDER_SITE_OTHER): Payer: Medicare Other | Admitting: Family Medicine

## 2016-03-17 VITALS — BP 132/88 | HR 94 | Temp 98.0°F | Resp 16 | Wt 282.0 lb

## 2016-03-17 DIAGNOSIS — I10 Essential (primary) hypertension: Secondary | ICD-10-CM

## 2016-03-17 DIAGNOSIS — E119 Type 2 diabetes mellitus without complications: Secondary | ICD-10-CM | POA: Diagnosis not present

## 2016-03-17 LAB — POCT GLYCOSYLATED HEMOGLOBIN (HGB A1C)
Est. average glucose Bld gHb Est-mCnc: 209
Hemoglobin A1C: 8.9

## 2016-03-17 MED ORDER — METFORMIN HCL ER 500 MG PO TB24: ORAL_TABLET | ORAL | 1 refills | Status: DC

## 2016-03-17 MED ORDER — CHLORTHALIDONE 25 MG PO TABS: 25.0000 mg | ORAL_TABLET | Freq: Every day | ORAL | 1 refills | Status: DC

## 2016-03-17 NOTE — Progress Notes (Signed)
Patient: Casey PinchSierra B Rowley Female    DOB: 09-29-1990   25 y.o.   MRN: [id number] Visit Date: 03/17/2016  Today's Provider: Mila Merryonald Cherylynn Liszewski, MD   Chief Complaint  Patient presents with  . Follow-up  . Diabetes  . Hypertension   Subjective:    HPI  Diabetes Mellitus Type II, Follow-up:   Lab Results  Component Value Date   HGBA1C 8.8 (H) 07/09/2015   HGBA1C 6.7 08/04/2013    Last seen for diabetes 6 months ago.  Management since then includes starting Metformin. She reports good compliance with treatment. She is not having side effects.  Current symptoms include none and have been stable. Home blood sugar records: are not being checked  Episodes of hypoglycemia? no   Current Insulin Regimen: none Most Recent Eye Exam: > 1 year Weight trend: fluctuating a bit Prior visit with dietician: no Current diet: well balanced Current exercise: walking  Pertinent Labs:    Component Value Date/Time   CHOL 134 07/09/2015 1116   TRIG 50 07/09/2015 1116   HDL 46 07/09/2015 1116   LDLCALC 78 07/09/2015 1116   CREATININE 0.65 11/07/2015 1951   CREATININE 0.74 04/01/2014 1725    Wt Readings from Last 3 Encounters:  11/07/15 280 lb (127 kg)  10/26/15 293 lb (132.9 kg)  10/11/15 290 lb (131.5 kg)    ------------------------------------------------------------------------  Hypertension, follow-up:  BP Readings from Last 3 Encounters:  11/07/15 (!) 162/104  10/26/15 (!) 150/90  10/11/15 (!) 150/88    She was last seen for hypertension 6 months ago.  BP at that visit was 130/88. Management since that visit includes no changes. She reports good compliance with treatment. She is not having side effects.  She is exercising. She is adherent to low salt diet.   Outside blood pressures are not being checked. She is experiencing none.  Patient denies chest pain, chest pressure/discomfort, claudication, dyspnea, exertional chest pressure/discomfort, fatigue,  irregular heart beat, lower extremity edema, near-syncope, orthopnea, palpitations, paroxysmal nocturnal dyspnea, syncope and tachypnea.   Cardiovascular risk factors include diabetes mellitus and hypertension.  Use of agents associated with hypertension: none.     Weight trend: fluctuating a bit Wt Readings from Last 3 Encounters:  11/07/15 280 lb (127 kg)  10/26/15 293 lb (132.9 kg)  10/11/15 290 lb (131.5 kg)    Current diet: well balanced  ------------------------------------------------------------------------  States stomach is doing much better, has stopped taking omeprazole.       Allergies  Allergen Reactions  . Sulfa Antibiotics      Current Outpatient Prescriptions:  .  albuterol (PROVENTIL HFA) 108 (90 Base) MCG/ACT inhaler, Inhale 2 puffs into the lungs every 6 (six) hours as needed., Disp: 18 g, Rfl: 3 .  chlorthalidone (HYGROTON) 25 MG tablet, Take 1 tablet (25 mg total) by mouth daily., Disp: 30 tablet, Rfl: 5 .  diltiazem (CARTIA XT) 120 MG 24 hr capsule, TAKE 1 CAPSULE BY MOUTH EVERY DAY, Disp: 30 capsule, Rfl: 3 .  meclizine (ANTIVERT) 25 MG tablet, Take 1 tablet (25 mg total) by mouth 3 (three) times daily as needed for dizziness., Disp: 30 tablet, Rfl: 0 .  metFORMIN (GLUCOPHAGE-XR) 500 MG 24 hr tablet, TAKE 1 TABLET(500 MG) BY MOUTH DAILY WITH BREAKFAST FOR DIABETES, Disp: 90 tablet, Rfl: 0 .  omeprazole (PRILOSEC) 20 MG capsule, Take 20 mg by mouth 2 (two) times daily before a meal., Disp: , Rfl:  .  oxyCODONE-acetaminophen (ROXICET) 5-325 MG tablet, Take 1  tablet by mouth every 4 (four) hours as needed for severe pain., Disp: 10 tablet, Rfl: 0 .  ranitidine (ZANTAC) 150 MG tablet, Take 2 tablets (300 mg total) by mouth 2 (two) times daily., Disp: 60 tablet, Rfl: 0 .  sucralfate (CARAFATE) 1 g tablet, TAKE 1 TABLET(1 GRAM) BY MOUTH FOUR TIMES DAILY AT BEDTIME WITH MEALS, Disp: 28 tablet, Rfl: 1  Review of Systems  Constitutional: Negative for appetite  change, chills, fatigue and fever.  Respiratory: Negative for chest tightness and shortness of breath.   Cardiovascular: Negative for chest pain and palpitations.  Gastrointestinal: Negative for abdominal pain, nausea and vomiting.  Neurological: Negative for dizziness and weakness.    Social History  Substance Use Topics  . Smoking status: Current Some Day Smoker    Types: Cigarettes  . Smokeless tobacco: Never Used  . Alcohol use No   Objective:   BP 132/88 (BP Location: Right Arm, Patient Position: Sitting, Cuff Size: Large)   Pulse 94   Temp 98 F (36.7 C) (Oral)   Resp 16   Wt 282 lb (127.9 kg)   SpO2 99% Comment: room air  BMI 45.52 kg/m   Physical Exam  General Appearance:    Alert, cooperative, no distress, obese  Eyes:    PERRL, conjunctiva/corneas clear, EOM's intact       Lungs:     Clear to auscultation bilaterally, respirations unlabored  Heart:    Regular rate and rhythm  Neurologic:   Awake, alert, oriented x 3. No apparent focal neurological           defect.        Results for orders placed or performed in visit on 03/17/16  POCT HgB A1C  Result Value Ref Range   Hemoglobin A1C 8.9    Est. average glucose Bld gHb Est-mCnc 209        Assessment & Plan:     1. Type 2 diabetes mellitus without complication, without long-term current use of insulin (HCC) Is to double dose of metformin - POCT HgB A1C - metFORMIN (GLUCOPHAGE-XR) 500 MG 24 hr tablet; Take two tablets daily  Dispense: 180 tablet; Refill: 1 - Amb ref to Medical Nutrition Therapy-MNT  2. Essential hypertension Much better on chlorthalidone. Continue current medications.   - chlorthalidone (HYGROTON) 25 MG tablet; Take 1 tablet (25 mg total) by mouth daily.  Dispense: 90 tablet; Refill: 1  Return in about 3 months (around 06/17/2016).        Mila Merryonald Dillian Feig, MD  [facility]

## 2016-04-14 ENCOUNTER — Encounter: Payer: Self-pay | Admitting: Family Medicine

## 2016-04-17 ENCOUNTER — Other Ambulatory Visit: Payer: Self-pay | Admitting: Family Medicine

## 2016-04-17 ENCOUNTER — Ambulatory Visit (INDEPENDENT_AMBULATORY_CARE_PROVIDER_SITE_OTHER): Payer: Medicare Other | Admitting: Family Medicine

## 2016-04-17 VITALS — BP 126/70 | HR 64 | Temp 98.0°F | Resp 16 | Wt 282.0 lb

## 2016-04-17 DIAGNOSIS — J069 Acute upper respiratory infection, unspecified: Secondary | ICD-10-CM | POA: Diagnosis not present

## 2016-04-17 DIAGNOSIS — M79605 Pain in left leg: Secondary | ICD-10-CM

## 2016-04-17 DIAGNOSIS — B9789 Other viral agents as the cause of diseases classified elsewhere: Secondary | ICD-10-CM | POA: Diagnosis not present

## 2016-04-17 MED ORDER — DEXTROMETHORPHAN POLISTIREX ER 30 MG/5ML PO SUER
30.0000 mg | Freq: Two times a day (BID) | ORAL | 0 refills | Status: DC
Start: 2016-04-17 — End: 2016-05-23

## 2016-04-17 MED ORDER — NORTRIPTYLINE HCL 25 MG PO CAPS: 25.0000 mg | ORAL_CAPSULE | Freq: Every day | ORAL | 0 refills | Status: DC

## 2016-04-17 MED ORDER — PSEUDOEPHEDRINE-GUAIFENESIN ER 60-600 MG PO TB12: 1.0000 | ORAL_TABLET | Freq: Two times a day (BID) | ORAL | 0 refills | Status: DC

## 2016-04-17 NOTE — Patient Instructions (Signed)
Discussed use of Mucinex D for congestion, Delsym for cough, and Benadryl for postnasal drainage 

## 2016-04-17 NOTE — Progress Notes (Signed)
Subjective:     Patient ID: Casey Reese, female   DOB: [birth year], 25 y.o.   MRN: [id number]  HPI  Chief Complaint  Patient presents with  . Sinus Problem    Patient comes in office today with concerns of sinus congestion and sore throat for the past 2 days.   . Leg Pain    Patient reports pain in her left leg that has been ongoing. Patient describes pain as sharp and shooting, she has been taking otc Tylenol for relief.   States she has had chills but no cough at this time. Reports prior phantom leg pains. States current leg pain occurs intermittently at night but can feel "needle-like." Denies injury or prosthetic leg use.   Review of Systems     Objective:   Physical Exam  Constitutional: She appears well-developed and well-nourished. No distress.  Musculoskeletal:  Left thigh HF/HE 5/5  Skin:  No stump tenderness or sign of infection  Ears: R T.M obscured by cerumen, L T.M without inflammation Throat: no tonsillar enlargement or exudate Neck: no cervical adenopathy Lungs: clear     Assessment:    1. Viral upper respiratory tract infection - pseudoephedrine-guaifenesin (MUCINEX D) 60-600 MG 12 hr tablet; Take 1 tablet by mouth every 12 (twelve) hours.  Dispense: 20 tablet; Refill: 0 - dextromethorphan (DELSYM) 30 MG/5ML liquid; Take 5 mLs (30 mg total) by mouth 2 (two) times daily.  Dispense: 89 mL; Refill: 0  2. Pain of left lower extremity: suspect neurogenic - nortriptyline (PAMELOR) 25 MG capsule; Take 1 capsule (25 mg total) by mouth at bedtime.  Dispense: 30 capsule; Refill: 0    Plan:    Deferred request for "stronger" pain medication and suggested f/u with her primary M.D., Dr. Sherrie Reese.

## 2016-05-16 ENCOUNTER — Emergency Department
Admission: EM | Admit: 2016-05-16 | Discharge: 2016-05-17 | Disposition: A | Discharge status: Home / Self Care | Payer: Medicare Other | Attending: Emergency Medicine | Admitting: Emergency Medicine

## 2016-05-16 ENCOUNTER — Encounter: Payer: Self-pay | Admitting: Emergency Medicine

## 2016-05-16 DIAGNOSIS — R197 Diarrhea, unspecified: Secondary | ICD-10-CM | POA: Diagnosis present

## 2016-05-16 DIAGNOSIS — F17210 Nicotine dependence, cigarettes, uncomplicated: Secondary | ICD-10-CM | POA: Diagnosis not present

## 2016-05-16 DIAGNOSIS — I10 Essential (primary) hypertension: Secondary | ICD-10-CM | POA: Insufficient documentation

## 2016-05-16 DIAGNOSIS — N390 Urinary tract infection, site not specified: Secondary | ICD-10-CM | POA: Diagnosis not present

## 2016-05-16 DIAGNOSIS — Z7984 Long term (current) use of oral hypoglycemic drugs: Secondary | ICD-10-CM | POA: Insufficient documentation

## 2016-05-16 DIAGNOSIS — K529 Noninfective gastroenteritis and colitis, unspecified: Secondary | ICD-10-CM | POA: Diagnosis not present

## 2016-05-16 DIAGNOSIS — E119 Type 2 diabetes mellitus without complications: Secondary | ICD-10-CM | POA: Insufficient documentation

## 2016-05-16 DIAGNOSIS — N3000 Acute cystitis without hematuria: Secondary | ICD-10-CM | POA: Insufficient documentation

## 2016-05-16 DIAGNOSIS — Z79899 Other long term (current) drug therapy: Secondary | ICD-10-CM | POA: Diagnosis not present

## 2016-05-16 LAB — COMPREHENSIVE METABOLIC PANEL
ALT: 29 U/L (ref 14–54)
AST: 41 U/L (ref 15–41)
Albumin: 4.1 g/dL (ref 3.5–5.0)
Alkaline Phosphatase: 64 U/L (ref 38–126)
Anion gap: 12 (ref 5–15)
BUN: 9 mg/dL (ref 6–20)
CALCIUM: 8.7 mg/dL — AB (ref 8.9–10.3)
CO2: 25 mmol/L (ref 22–32)
Chloride: 100 mmol/L — ABNORMAL LOW (ref 101–111)
Creatinine, Ser: 0.79 mg/dL (ref 0.44–1.00)
Glucose, Bld: 161 mg/dL — ABNORMAL HIGH (ref 65–99)
Potassium: 3 mmol/L — ABNORMAL LOW (ref 3.5–5.1)
Sodium: 137 mmol/L (ref 135–145)
Total Bilirubin: 0.3 mg/dL (ref 0.3–1.2)
Total Protein: 8.2 g/dL — ABNORMAL HIGH (ref 6.5–8.1)

## 2016-05-16 LAB — CBC
HCT: 39.5 % (ref 35.0–47.0)
Hemoglobin: 12.9 g/dL (ref 12.0–16.0)
MCH: 25.6 pg — ABNORMAL LOW (ref 26.0–34.0)
MCHC: 32.6 g/dL (ref 32.0–36.0)
MCV: 78.4 fL — AB (ref 80.0–100.0)
PLATELETS: 376 10*3/uL (ref 150–440)
RBC: 5.03 MIL/uL (ref 3.80–5.20)
RDW: 14.8 % — ABNORMAL HIGH (ref 11.5–14.5)
WBC: 8 10*3/uL (ref 3.6–11.0)

## 2016-05-16 LAB — URINALYSIS, COMPLETE (UACMP) WITH MICROSCOPIC
BILIRUBIN URINE: NEGATIVE
GLUCOSE, UA: NEGATIVE mg/dL
KETONES UR: NEGATIVE mg/dL
NITRITE: NEGATIVE
PH: 5 (ref 5.0–8.0)
Protein, ur: 30 mg/dL — AB
Specific Gravity, Urine: 1.029 (ref 1.005–1.030)

## 2016-05-16 LAB — LIPASE, BLOOD: LIPASE: 19 U/L (ref 11–51)

## 2016-05-16 MED ORDER — CIPROFLOXACIN HCL 500 MG PO TABS: 500.0000 mg | ORAL_TABLET | Freq: Two times a day (BID) | ORAL | 0 refills | Status: AC

## 2016-05-16 MED ORDER — ONDANSETRON 4 MG PO TBDP
4.0000 mg | ORAL_TABLET | Freq: Once | ORAL | Status: AC
  Administered 2016-05-17: 4 mg via ORAL
  Filled 2016-05-16: qty 1

## 2016-05-16 MED ORDER — POTASSIUM CHLORIDE CRYS ER 20 MEQ PO TBCR
40.0000 meq | EXTENDED_RELEASE_TABLET | Freq: Once | ORAL | Status: AC
  Administered 2016-05-17: 40 meq via ORAL
  Filled 2016-05-16: qty 2

## 2016-05-16 MED ORDER — ONDANSETRON 4 MG PO TBDP: 4.0000 mg | ORAL_TABLET | Freq: Three times a day (TID) | ORAL | 0 refills | Status: DC | PRN

## 2016-05-16 MED ORDER — CIPROFLOXACIN HCL 500 MG PO TABS
500.0000 mg | ORAL_TABLET | Freq: Once | ORAL | Status: AC
  Administered 2016-05-17: 500 mg via ORAL
  Filled 2016-05-16: qty 1

## 2016-05-16 NOTE — ED Triage Notes (Signed)
Pt states for the past three days she has experienced N/V/D x3 days. Pt states that she has not had any diarrhea today but did throw up one time this morning but has been able to eat a meal since that time with no vomiting. Pt is ambulatory to triage with NAD.

## 2016-05-16 NOTE — ED Provider Notes (Addendum)
[facility] Emergency Department Provider Note        Time seen: ----------------------------------------- 11:19 PM on 05/16/2016 -----------------------------------------    I have reviewed the triage vital signs and the nursing notes.   HISTORY  Chief Complaint Emesis    HPI Casey Reese is a 26 y.o. female who presents to ER for nausea, vomiting and diarrhea for the past 3 days. Patient states she's not had any diarrhea today but she did throw up one time this morning but has been able to eat a meal since that time with no vomiting. She states pain was 8 out of 10 in her upper abdomen, nothing made it better or worse.   Past Medical History:  Diagnosis Date  . Diabetes mellitus without complication (HCC)   . History of chicken pox   . Hypertension   . S/P above knee amputation (HCC)   . 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 Helicobacter pylori gastritis 07/09/2015  . Essential hypertension 06/29/2015  . Diabetes mellitus (HCC) 06/04/2015  . Morbid obesity (HCC) 01/18/2008  . 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 ABOVE KNEE LEG AMPUTATION Left 2003  . HIP FUSION Left     Allergies Sulfa antibiotics  Social History Social History  Substance Use Topics  . Smoking status: Current Some Day Smoker    Types: Cigarettes  . Smokeless tobacco: Never Used  . Alcohol use No    Review of Systems Constitutional: Negative for fever. Cardiovascular: Negative for chest pain. Respiratory: Negative for shortness of breath. Gastrointestinal: Positive for abdominal pain, vomiting and diarrhea Genitourinary: Negative for dysuria. Musculoskeletal: Negative for back pain. Skin: Negative for rash. Neurological: Negative for headaches, focal  weakness or numbness.  10-point ROS otherwise negative.  ____________________________________________   PHYSICAL EXAM:  VITAL SIGNS: ED Triage Vitals  Enc Vitals Group     BP 05/16/16 2215 (!) 142/84     Pulse Rate 05/16/16 2215 89     Resp 05/16/16 2215 16     Temp 05/16/16 2215 98.4 F (36.9 C)     Temp Source 05/16/16 2215 Oral     SpO2 05/16/16 2215 98 %     Weight 05/16/16 2216 280 lb (127 kg)     Height 05/16/16 2216 5\' 6"  (1.676 m)     Head Circumference --      Peak Flow --      Pain Score 05/16/16 2217 8     Pain Loc --      Pain Edu? --      Excl. in GC? --     Constitutional: Alert and oriented. Well appearing and in no distress. Eyes: Conjunctivae are normal. PERRL. Normal extraocular movements. ENT   Head: Normocephalic and atraumatic.   Nose: No congestion/rhinnorhea.   Mouth/Throat: Mucous membranes are moist.   Neck: No stridor. Cardiovascular: Normal rate, regular rhythm. No murmurs, rubs, or gallops. Respiratory: Normal respiratory effort without tachypnea nor retractions. Breath sounds are clear and equal bilaterally. No wheezes/rales/rhonchi. Gastrointestinal: Soft and nontender. Normal bowel sounds Musculoskeletal: Nontender with normal range of motion in all extremities. No lower extremity tenderness nor edema. Neurologic:  Normal speech and language. No gross focal neurologic deficits are appreciated.  Skin:  Skin is warm, dry and intact. No rash noted.  Psychiatric: Mood and affect are normal. Speech and behavior are normal.  ____________________________________________  ED COURSE:  Pertinent labs & imaging results that were available during my care of the patient were reviewed by me and considered in my medical decision making (see chart for details). Patient is in no distress, we will check basic labs and provide antiemetics.   Procedures ____________________________________________   LABS (pertinent  positives/negatives)  Labs Reviewed  COMPREHENSIVE METABOLIC PANEL - Abnormal; Notable for the following:       Result Value   Potassium 3.0 (*)    Chloride 100 (*)    Glucose, Bld 161 (*)    Calcium 8.7 (*)    Total Protein 8.2 (*)    All other components within normal limits  CBC - Abnormal; Notable for the following:    MCV 78.4 (*)    MCH 25.6 (*)    RDW 14.8 (*)    All other components within normal limits  URINALYSIS, COMPLETE (UACMP) WITH MICROSCOPIC - Abnormal; Notable for the following:    Color, Urine AMBER (*)    APPearance HAZY (*)    Hgb urine dipstick MODERATE (*)    Protein, ur 30 (*)    Leukocytes, UA MODERATE (*)    Bacteria, UA FEW (*)    Squamous Epithelial / LPF 6-30 (*)    All other components within normal limits  LIPASE, BLOOD   ____________________________________________  FINAL ASSESSMENT AND PLAN  Gastroenteritis, UTI  Plan: Patient with labs as dictated above. Patient was given Zofran as well as potassium and Septra DS for her UTI. She is currently on her menstrual cycle and it is doubtful that she has true hematuria. She is stable for outpatient follow-up.   Emily Filbert, MD   Note: This note was generated in part or whole with voice recognition software. Voice recognition is usually quite accurate but there are transcription errors that can and very often do occur. I apologize for any typographical errors that were not detected and corrected.     Emily Filbert, MD 05/16/16 1610    Emily Filbert, MD 05/16/16 [phone]

## 2016-05-17 DIAGNOSIS — K529 Noninfective gastroenteritis and colitis, unspecified: Secondary | ICD-10-CM | POA: Diagnosis not present

## 2016-05-17 NOTE — ED Notes (Signed)
Developed nausea and vomiting today, denies any other symptom pt talks in complete sentences no distress noted

## 2016-05-17 NOTE — ED Notes (Signed)
Pt verbalizes understanding of discharge instructions.

## 2016-05-23 ENCOUNTER — Other Ambulatory Visit: Payer: Self-pay | Admitting: Family Medicine

## 2016-05-23 ENCOUNTER — Ambulatory Visit (INDEPENDENT_AMBULATORY_CARE_PROVIDER_SITE_OTHER): Payer: Medicare Other | Admitting: Family Medicine

## 2016-05-23 ENCOUNTER — Encounter: Payer: Self-pay | Admitting: Family Medicine

## 2016-05-23 VITALS — BP 120/70 | HR 81 | Temp 98.2°F | Resp 16 | Wt 287.0 lb

## 2016-05-23 DIAGNOSIS — K529 Noninfective gastroenteritis and colitis, unspecified: Secondary | ICD-10-CM | POA: Diagnosis not present

## 2016-05-23 DIAGNOSIS — K219 Gastro-esophageal reflux disease without esophagitis: Secondary | ICD-10-CM

## 2016-05-23 DIAGNOSIS — R1084 Generalized abdominal pain: Secondary | ICD-10-CM | POA: Diagnosis not present

## 2016-05-23 DIAGNOSIS — R110 Nausea: Secondary | ICD-10-CM | POA: Diagnosis not present

## 2016-05-23 DIAGNOSIS — N3000 Acute cystitis without hematuria: Secondary | ICD-10-CM

## 2016-05-23 DIAGNOSIS — R1013 Epigastric pain: Secondary | ICD-10-CM

## 2016-05-23 LAB — POCT URINALYSIS DIPSTICK
Bilirubin, UA: NEGATIVE
Glucose, UA: NEGATIVE
Ketones, UA: NEGATIVE
LEUKOCYTES UA: NEGATIVE
NITRITE UA: NEGATIVE
PH UA: 7
RBC UA: NEGATIVE
Spec Grav, UA: 1.02
UROBILINOGEN UA: 0.2

## 2016-05-23 MED ORDER — FAMOTIDINE 40 MG PO TABS: 40.0000 mg | ORAL_TABLET | Freq: Every day | ORAL | 5 refills | Status: DC

## 2016-05-23 MED ORDER — SUCRALFATE 1 G PO TABS: ORAL_TABLET | ORAL | 1 refills | Status: DC

## 2016-05-23 MED ORDER — HYOSCYAMINE SULFATE 0.125 MG SL SUBL: 0.1250 mg | SUBLINGUAL_TABLET | SUBLINGUAL | 0 refills | Status: DC | PRN

## 2016-05-23 NOTE — Progress Notes (Signed)
Patient: Casey Reese Female    DOB: Aug 21, 1990   26 y.o.   MRN: [id number] Visit Date: 05/23/2016  Today's Provider: Mila Merryonald Arick Mareno, MD   No chief complaint on file.  Subjective:    HPI   Follow up ER visit  Patient was seen in ER for nausea, vomiting and diarrhea on 05/16/2016. She was treated for UTI. Treatment for this included Zofran as well as potassium and Septra DS for her UTI. She reports good compliance with treatment. She reports this condition is Unchanged. Patient is still having abdominal pain and slight diarrhea. She did have an episode of vomiting a few days ago but she thinks it was related to her reflux. She has abdominal cramps several times a day.   ----------------------------------------------------------------  Has stopped omeprazole because it seemed to make her dizzy, but now having more stomach pains and belching. Was seen at [facility] (Dr. Marva PandaSkulskie) in August for severe GERD. She was scheduled for EGD in October, but she moved to [place] and was not able to get back for procedure. She has been sucrafate that was prescribed, but is running out. She would like [place] get [place].    Allergies  Allergen Reactions  . Sulfa Antibiotics      Current Outpatient Prescriptions:  .  albuterol (PROVENTIL HFA) 108 (90 Base) MCG/ACT inhaler, Inhale 2 puffs into the lungs every 6 (six) hours as needed., Disp: 18 g, Rfl: 3 .  chlorthalidone (HYGROTON) 25 MG tablet, Take 1 tablet (25 mg total) by mouth daily., Disp: 90 tablet, Rfl: 1 .  ciprofloxacin (CIPRO) 500 MG tablet, Take 1 tablet (500 mg total) by mouth 2 (two) times daily., Disp: 20 tablet, Rfl: 0 .  dextromethorphan (DELSYM) 30 MG/5ML liquid, Take 5 mLs (30 mg total) by mouth 2 (two) times daily., Disp: 89 mL, Rfl: 0 .  diltiazem (CARTIA XT) 120 MG 24 hr capsule, TAKE 1 CAPSULE BY MOUTH EVERY DAY, Disp: 30 capsule, Rfl: 3 .  meclizine (ANTIVERT) 25 MG tablet, Take 1  tablet (25 mg total) by mouth 3 (three) times daily as needed for dizziness., Disp: 30 tablet, Rfl: 0 .  metFORMIN (GLUCOPHAGE-XR) 500 MG 24 hr tablet, Take two tablets daily, Disp: 180 tablet, Rfl: 1 .  nortriptyline (PAMELOR) 25 MG capsule, TAKE 1 CAPSULE(25 MG) BY MOUTH AT BEDTIME, Disp: 90 capsule, Rfl: 0 .  omeprazole (PRILOSEC) 20 MG capsule, Take 20 mg by mouth 2 (two) times daily before a meal., Disp: , Rfl:  .  ondansetron (ZOFRAN ODT) 4 MG disintegrating tablet, Take 1 tablet (4 mg total) by mouth every 8 (eight) hours as needed for nausea or vomiting., Disp: 20 tablet, Rfl: 0 .  pseudoephedrine-guaifenesin (MUCINEX D) 60-600 MG 12 hr tablet, Take 1 tablet by mouth every 12 (twelve) hours., Disp: 20 tablet, Rfl: 0 .  ranitidine (ZANTAC) 150 MG tablet, Take 2 tablets (300 mg total) by mouth 2 (two) times daily., Disp: 60 tablet, Rfl: 0 .  sucralfate (CARAFATE) 1 g tablet, TAKE 1 TABLET(1 GRAM) BY MOUTH FOUR TIMES DAILY AT BEDTIME WITH MEALS, Disp: 28 tablet, Rfl: 1  Review of Systems  Constitutional: Negative for appetite change, chills, fatigue and fever.  Respiratory: Negative for chest tightness and shortness of breath.   Cardiovascular: Negative for chest pain and palpitations.  Gastrointestinal: Negative for abdominal pain, nausea and vomiting.  Neurological: Negative for dizziness and weakness.    Social History  Substance  Use Topics  . Smoking status: Current Some Day Smoker    Types: Cigarettes  . Smokeless tobacco: Never Used  . Alcohol use No   Objective:   BP 120/70 (BP Location: Left Arm, Patient Position: Sitting, Cuff Size: Large)   Pulse 81   Temp 98.2 F (36.8 C) (Oral)   Resp 16   Wt 287 lb (130.2 kg)   LMP 04/24/2016 (Approximate)   SpO2 99%   BMI 46.32 kg/m   Physical Exam  General Appearance:    Alert, cooperative, no distress, obese  Eyes:    PERRL, conjunctiva/corneas clear, EOM's intact       Lungs:     Clear to auscultation bilaterally,  respirations unlabored  Heart:    Regular rate and rhythm  Neurologic:   Awake, alert, oriented x 3. No apparent focal neurological           defect.       Results for orders placed or performed in visit on 05/23/16  POCT urinalysis dipstick  Result Value Ref Range   Color, UA yellow    Clarity, UA clear    Glucose, UA negative    Bilirubin, UA negative    Ketones, UA negative    Spec Grav, UA 1.020    Blood, UA negative    pH, UA 7.0    Protein, UA trace    Urobilinogen, UA 0.2    Nitrite, UA negative    Leukocytes, UA Negative Negative       Assessment & Plan:     1. Gastroenteritis Mostly resolved. Encouraged to push clear liquids, mainly water.   2. Nausea Improving, still has a few Zofran  3. Acute cystitis without hematuria U/a is clear today. She is to finish Cipro prescribed from ER.  - POCT urinalysis dipstick  4. Epigastric pain Chronic, history of h.pylori that was treated. Unable to get reliable transportation [place] [facility] for GI follow up. Will refer to GI closer to home - Ambulatory referral to Gastroenterology - famotidine (PEPCID) 40 MG tablet; Take 1 tablet (40 mg total) by mouth daily.  Dispense: 30 tablet; Refill: 5  5. Generalized abdominal pain  - Ambulatory referral to Gastroenterology - hyoscyamine (LEVSIN SL) 0.125 MG SL tablet; Place 1 tablet (0.125 mg total) under the tongue every 4 (four) hours as needed (abdominal cramping).  Dispense: 30 tablet; Refill: 0  6. Gastroesophageal reflux disease, esophagitis presence not specified She feels like prilosec makes her dizzy, but has taken Pepcid in the past which thought worked well. Get prescription for this as above and continue sucralfate until established with GI.        Mila Merry, MD  [facility]

## 2016-06-23 ENCOUNTER — Ambulatory Visit: Payer: Medicare Other | Admitting: Family Medicine

## 2016-06-26 ENCOUNTER — Ambulatory Visit (INDEPENDENT_AMBULATORY_CARE_PROVIDER_SITE_OTHER): Payer: Medicare Other | Admitting: Family Medicine

## 2016-06-26 ENCOUNTER — Encounter: Payer: Self-pay | Admitting: Family Medicine

## 2016-06-26 VITALS — BP 108/70 | HR 107 | Temp 99.2°F | Resp 17 | Wt 282.2 lb

## 2016-06-26 DIAGNOSIS — J101 Influenza due to other identified influenza virus with other respiratory manifestations: Secondary | ICD-10-CM | POA: Diagnosis not present

## 2016-06-26 DIAGNOSIS — B349 Viral infection, unspecified: Secondary | ICD-10-CM

## 2016-06-26 LAB — POC INFLUENZA A&B (BINAX/QUICKVUE)
INFLUENZA A, POC: NEGATIVE
Influenza B, POC: POSITIVE — AB

## 2016-06-26 MED ORDER — ACETAMINOPHEN 500 MG PO TABS: ORAL_TABLET | ORAL | 0 refills | Status: DC

## 2016-06-26 MED ORDER — HYDROCOD POLST-CPM POLST ER 10-8 MG/5ML PO SUER: 5.0000 mL | Freq: Two times a day (BID) | ORAL | 0 refills | Status: DC | PRN

## 2016-06-26 MED ORDER — OSELTAMIVIR PHOSPHATE 75 MG PO CAPS: 75.0000 mg | ORAL_CAPSULE | Freq: Two times a day (BID) | ORAL | 0 refills | Status: DC

## 2016-06-26 NOTE — Patient Instructions (Signed)
Switch to Mucinex DM for cough.

## 2016-06-26 NOTE — Progress Notes (Addendum)
Subjective:     Patient ID: Casey Reese, female   DOB: March 15, 1991, 26 y.o.   MRN: [id number]  HPI  Chief Complaint  Patient presents with  . Cough    Patient comes in office today with concerns of cough and congestion for the past 48hrs. Associated with cough patient complains of sinus pain/pressure, chest pain, body aches, watery eyes, runny nose and headache. Patient has been taking otc Tylenol PM, Mucinex and Dayquil  Reports exposure to the flu from her nephew. No flu shot this season.   Review of Systems     Objective:   Physical Exam  Constitutional: She appears well-developed and well-nourished. No distress.  Ears: L T.M intact without inflammation; right TM obscured by cerumen Throat: no tonsillar enlargement or exudate Neck: no cervical adenopathy Lungs: clear     Assessment:    1. Influenza due to influenza virus, type B - POC Influenza A&B(BINAX/QUICKVUE) - oseltamivir (TAMIFLU) 75 MG capsule; Take 1 capsule (75 mg total) by mouth 2 (two) times daily.  Dispense: 10 capsule; Refill: 0 - acetaminophen (TYLENOL) 500 MG tablet; One or two 3 x day for aches  Dispense: 30 tablet; Refill: 0    Plan:    Discussed infection control measures.

## 2016-06-27 ENCOUNTER — Telehealth: Payer: Self-pay

## 2016-06-27 ENCOUNTER — Other Ambulatory Visit: Payer: Self-pay | Admitting: Family Medicine

## 2016-06-27 MED ORDER — BENZONATATE 100 MG PO CAPS: 100.0000 mg | ORAL_CAPSULE | Freq: Two times a day (BID) | ORAL | 0 refills | Status: DC | PRN

## 2016-06-27 MED ORDER — IBUPROFEN 600 MG PO TABS: 600.0000 mg | ORAL_TABLET | Freq: Three times a day (TID) | ORAL | 0 refills | Status: DC | PRN

## 2016-06-27 NOTE — Telephone Encounter (Signed)
Pt was in yesterday for the Flu.  She was prescribed Tamiflu and Tussionex.  She reports she did not pick up the Tussionex secondary to cost.  She has been taking tylenol for the body aches and it is not helping.  She would like something stronger called [address] [facility] [address]   Thanks,   -Casey RiegerLaura

## 2016-06-27 NOTE — Telephone Encounter (Signed)
Advised patient as below.  

## 2016-06-27 NOTE — Telephone Encounter (Signed)
Have sent rx for tessalon and ibuprofen to pharmacy.

## 2016-06-28 ENCOUNTER — Emergency Department: Payer: Medicare Other

## 2016-06-28 ENCOUNTER — Emergency Department
Admission: EM | Admit: 2016-06-28 | Discharge: 2016-06-28 | Disposition: A | Discharge status: Home / Self Care | Payer: Medicare Other | Attending: Emergency Medicine | Admitting: Emergency Medicine

## 2016-06-28 ENCOUNTER — Encounter: Payer: Self-pay | Admitting: Emergency Medicine

## 2016-06-28 DIAGNOSIS — Z79899 Other long term (current) drug therapy: Secondary | ICD-10-CM | POA: Diagnosis not present

## 2016-06-28 DIAGNOSIS — R079 Chest pain, unspecified: Secondary | ICD-10-CM | POA: Diagnosis present

## 2016-06-28 DIAGNOSIS — R0789 Other chest pain: Secondary | ICD-10-CM | POA: Diagnosis not present

## 2016-06-28 DIAGNOSIS — I10 Essential (primary) hypertension: Secondary | ICD-10-CM | POA: Insufficient documentation

## 2016-06-28 DIAGNOSIS — R05 Cough: Secondary | ICD-10-CM | POA: Insufficient documentation

## 2016-06-28 DIAGNOSIS — R059 Cough, unspecified: Secondary | ICD-10-CM

## 2016-06-28 DIAGNOSIS — Z7984 Long term (current) use of oral hypoglycemic drugs: Secondary | ICD-10-CM | POA: Diagnosis not present

## 2016-06-28 DIAGNOSIS — F17210 Nicotine dependence, cigarettes, uncomplicated: Secondary | ICD-10-CM | POA: Insufficient documentation

## 2016-06-28 DIAGNOSIS — E119 Type 2 diabetes mellitus without complications: Secondary | ICD-10-CM | POA: Insufficient documentation

## 2016-06-28 LAB — CBC
HCT: 36.7 % (ref 35.0–47.0)
Hemoglobin: 12 g/dL (ref 12.0–16.0)
MCH: 24.7 pg — AB (ref 26.0–34.0)
MCHC: 32.6 g/dL (ref 32.0–36.0)
MCV: 75.8 fL — AB (ref 80.0–100.0)
Platelets: 296 10*3/uL (ref 150–440)
RBC: 4.84 MIL/uL (ref 3.80–5.20)
RDW: 16 % — AB (ref 11.5–14.5)
WBC: 4.7 10*3/uL (ref 3.6–11.0)

## 2016-06-28 LAB — BASIC METABOLIC PANEL
Anion gap: 10 (ref 5–15)
BUN: 5 mg/dL — AB (ref 6–20)
CALCIUM: 8.6 mg/dL — AB (ref 8.9–10.3)
CHLORIDE: 102 mmol/L (ref 101–111)
CO2: 24 mmol/L (ref 22–32)
CREATININE: 0.68 mg/dL (ref 0.44–1.00)
GFR calc Af Amer: >60 mL/min (ref >60)
GFR calc non Af Amer: >60 mL/min (ref >60)
Glucose, Bld: 182 mg/dL — ABNORMAL HIGH (ref 65–99)
Potassium: 3.5 mmol/L (ref 3.5–5.1)
SODIUM: 136 mmol/L (ref 135–145)

## 2016-06-28 LAB — TROPONIN I

## 2016-06-28 MED ORDER — IBUPROFEN 600 MG PO TABS: 600.0000 mg | ORAL_TABLET | Freq: Three times a day (TID) | ORAL | 0 refills | Status: DC | PRN

## 2016-06-28 NOTE — ED Triage Notes (Signed)
Pt reports intermittent chest tightness with cough, reports epigastric pain x2 days. Pt was dx with flu on Thursday, reports she's been taking tamiflu. Pt also reports "some blood" in her sputum after cough. Pt A/O, even and nonlabored respirations at this time.

## 2016-06-28 NOTE — ED Notes (Addendum)
Into patient room to inquire about what patient was last updated by EDP. PT states that she was awaiting results of chest xray by Dr. Alphonzo LemmingsMcShane. Dr. Alphonzo LemmingsMcShane not in ER at this time. Discharge paperwork found on printer. Pt states that she needs prescription medications for pain when coughing. Dr. Lamont Snowballifenbark informed who took over for Dr. Alphonzo LemmingsMcShane, he wrote for ibuprofen. Pt refusing ibuprofen prescription, asking for "some kind of narcotic". Charge nurse informed. Called Dr. Alphonzo LemmingsMcShane to make sure that no prescriptions were given and missed. No medications to be prescribed by Dr. Alphonzo LemmingsMcShane. PT informed and apologized to patient for miscommunication. Pt reports "yall are terrible". Offered wheelchair for discharge, refused. Pt left via crutches with friend who was at bedside.   Pt refusing repeat VS.

## 2016-06-28 NOTE — ED Provider Notes (Addendum)
[facility] Emergency Department Provider Note  ____________________________________________   I have reviewed the triage vital signs and the nursing notes.   HISTORY  Chief Complaint Chest Pain    HPI Casey Reese is a 26 y.o. female presents today complaining of cough. Patient was diagnosed with the flu a few days ago and her cough persists. She states it hurts to cough. She states she one saw a trace of blood in her mucus. She denies ongoing fever. She states that it hurts her chest when she coughs she had a "bad coughing fit" last night. However today he feels like things are getting somewhat better. She denies any ongoing fever or chills. Essentially she is getting better but she wants to make sure that this is how the flu is supposed to go.      Past Medical History:  Diagnosis Date  . Diabetes mellitus without complication (HCC)   . History of chicken pox   . Hypertension   . S/P above knee amputation (HCC)   . 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 GERD (gastroesophageal reflux disease) 05/23/2016  . Helicobacter pylori gastritis 07/09/2015  . Essential hypertension 06/29/2015  . Diabetes mellitus (HCC) 06/04/2015  . Morbid obesity (HCC) 01/18/2008  . 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 ABOVE KNEE LEG AMPUTATION Left 2003  . HIP FUSION Left     Prior to Admission medications   Medication Sig Start Date End Date Taking? Authorizing Provider  acetaminophen (TYLENOL) 500 MG tablet One or two 3 x day for aches 06/26/16   Anola Gurney, PA  albuterol (PROVENTIL HFA) 108 (90 Base) MCG/ACT inhaler Inhale 2 puffs into the lungs every 6 (six) hours as needed. 06/28/15   Malva Limes, MD  benzonatate (TESSALON) 100 MG capsule Take 1  capsule (100 mg total) by mouth 2 (two) times daily as needed for cough. 06/27/16 07/17/16  Malva Limes, MD  chlorthalidone (HYGROTON) 25 MG tablet Take 1 tablet (25 mg total) by mouth daily. 03/17/16   Malva Limes, MD  diltiazem (CARTIA XT) 120 MG 24 hr capsule TAKE 1 CAPSULE BY MOUTH EVERY DAY 03/05/16   Malva Limes, MD  famotidine (PEPCID) 40 MG tablet Take 1 tablet (40 mg total) by mouth daily. 05/23/16   Malva Limes, MD  hyoscyamine (LEVSIN SL) 0.125 MG SL tablet DISSOLVE 1 TABLET(0.125 MG) UNDER THE TONGUE EVERY 4 HOURS AS NEEDED FOR ABDOMINAL CRAMPS 05/23/16   Malva Limes, MD  ibuprofen (ADVIL,MOTRIN) 600 MG tablet Take 1 tablet (600 mg total) by mouth every 8 (eight) hours as needed for fever or moderate pain. 06/27/16 07/07/16  Malva Limes, MD  meclizine (ANTIVERT) 25 MG tablet Take 1 tablet (25 mg total) by mouth 3 (three) times daily as needed for dizziness. 08/20/15   Malva Limes, MD  metFORMIN (GLUCOPHAGE-XR) 500 MG 24 hr tablet Take two tablets daily 03/17/16   Malva Limes, MD  nortriptyline (PAMELOR) 25 MG capsule TAKE 1 CAPSULE(25 MG) BY MOUTH AT BEDTIME 04/17/16   Anola Gurney, PA  ondansetron (ZOFRAN ODT) 4 MG disintegrating tablet Take 1 tablet (4 mg total) by mouth every 8 (eight) hours as needed for nausea or vomiting. Patient not taking: Reported on 06/26/2016 05/16/16  Emily Filbert, MD  oseltamivir (TAMIFLU) 75 MG capsule Take 1 capsule (75 mg total) by mouth 2 (two) times daily. 06/26/16   Anola Gurney, PA  sucralfate (CARAFATE) 1 g tablet TAKE 1 TABLET BY MOUTH FOUR TIMES DAILY EVERY NIGHT AT BEDTIME WITH MEALS 05/23/16   Malva Limes, MD    Allergies Sulfa antibiotics  Family History  Problem Relation Age of Onset  . Diabetes Mother     type 2    Social History Social History  Substance Use Topics  . Smoking status: Current Some Day Smoker    Types: Cigarettes  . Smokeless tobacco: Never Used  . Alcohol use No    Review of  Systems Constitutional: No fever/chills Eyes: No visual changes. ENT: No sore throat. No stiff neck no neck pain Cardiovascular: History of present illness Respiratory the history of present illness Gastrointestinal:   no vomiting.  No diarrhea.  No constipation. Genitourinary: Negative for dysuria. Musculoskeletal: Negative lower extremity swelling Skin: Negative for rash. Neurological: Negative for severe headaches, focal weakness or numbness. 10-point ROS otherwise negative.  ____________________________________________   PHYSICAL EXAM:  VITAL SIGNS: ED Triage Vitals  Enc Vitals Group     BP 06/28/16 1134 134/78     Pulse Rate 06/28/16 1130 98     Resp 06/28/16 1130 18     Temp 06/28/16 1130 98.6 F (37 C)     Temp Source 06/28/16 1130 Oral     SpO2 06/28/16 1130 97 %     Weight 06/28/16 1130 282 lb (127.9 kg)     Height 06/28/16 1130 5\' 6"  (1.676 m)     Head Circumference --      Peak Flow --      Pain Score 06/28/16 1130 8     Pain Loc --      Pain Edu? --      Excl. in GC? --     Constitutional: Alert and oriented. Well appearing and in no acute distress. Eyes: Conjunctivae are normal. PERRL. EOMI. Head: Atraumatic. Nose: No congestion/rhinnorhea. Mouth/Throat: Mucous membranes are moist.  Oropharynx non-erythematous. Chest: Tender to palpation left chest wall etc. patient says "ouch that's the pain right there" and pulls back. No flail chest is no lesions noted. No inflammation Neck: No stridor.   Nontender with no meningismus Cardiovascular: Normal rate, regular rhythm. Grossly normal heart sounds.  Good peripheral circulation. Respiratory: Normal respiratory effort.  No retractions. Lungs CTAB. Abdominal: Soft and nontender. No distention. No guarding no rebound Back:  There is no focal tenderness or step off.  there is no midline tenderness there are no lesions noted. there is no CVA tenderness Musculoskeletal: No lower extremity tenderness, no upper  extremity tenderness. No joint effusions, no DVT signs strong distal pulses no edema, left-sided AKA noted Neurologic:  Normal speech and language. No gross focal neurologic deficits are appreciated.  Skin:  Skin is warm, dry and intact. No rash noted. Psychiatric: Mood and affect are normal. Speech and behavior are normal.  ____________________________________________   LABS (all labs ordered are listed, but only abnormal results are displayed)  Labs Reviewed  BASIC METABOLIC PANEL - Abnormal; Notable for the following:       Result Value   Glucose, Bld 182 (*)    BUN 5 (*)    Calcium 8.6 (*)    All other components within normal limits  CBC - Abnormal; Notable for the following:    MCV 75.8 (*)    MCH 24.7 (*)  RDW 16.0 (*)    All other components within normal limits  TROPONIN I   ____________________________________________  EKG  I personally interpreted any EKGs ordered by me or triage Normal sinus rhythm rate 92 bpm no acute ST elevation or acute ST depression ____________________________________________  RADIOLOGY  I reviewed any imaging ordered by me or triage that were performed during my shift and, if possible, patient and/or family made aware of any abnormal findings. ____________________________________________   PROCEDURES  Procedure(s) performed: None  Procedures  Critical Care performed: None  ____________________________________________   INITIAL IMPRESSION / ASSESSMENT AND PLAN / ED COURSE  Pertinent labs & imaging results that were available during my care of the patient were reviewed by me and considered in my medical decision making (see chart for details). Patient with very reproducible chest wall pain in the context of cough At this time, there does not appear to be clinical evidence to support the diagnosis of pulmonary embolus, dissection, myocarditis, endocarditis, pericarditis, pericardial tamponade, acute coronary syndrome,  pneumothorax, pneumonia, or any other acute intrathoracic pathology that will require admission or acute intervention. Nor is there evidence of any significant intra-abdominal pathology causing this discomfort.  ----------------------------------------- 2:41 PM on 06/28/2016 -----------------------------------------  The patient is playing a game on her phone in no acute distress. Eager to go home. Workup is very reassuring here. Return precautions and follow given and understood     ____________________________________________   FINAL CLINICAL IMPRESSION(S) / ED DIAGNOSES  Final diagnoses:  None      This chart was dictated using voice recognition software.  Despite best efforts to proofread,  errors can occur which can change meaning.      Jeanmarie PlantJames A Shalen Petrak, MD 06/28/16 1414    Jeanmarie PlantJames A Derick Seminara, MD 06/28/16 1441    Jeanmarie PlantJames A Orabelle Rylee, MD 06/28/16 [phone]

## 2016-06-28 NOTE — ED Notes (Signed)
Pt alert and oriented X4, active, cooperative, pt in NAD. RR even and unlabored, color WNL.  Pt informed to return if any life threatening symptoms occur.   

## 2016-06-30 ENCOUNTER — Encounter: Payer: Self-pay | Admitting: Family Medicine

## 2016-06-30 ENCOUNTER — Ambulatory Visit (INDEPENDENT_AMBULATORY_CARE_PROVIDER_SITE_OTHER): Payer: Medicare Other | Admitting: Family Medicine

## 2016-06-30 ENCOUNTER — Other Ambulatory Visit: Payer: Self-pay | Admitting: Family Medicine

## 2016-06-30 VITALS — BP 124/80 | HR 86 | Temp 97.9°F | Resp 18 | Wt 272.0 lb

## 2016-06-30 DIAGNOSIS — I10 Essential (primary) hypertension: Secondary | ICD-10-CM | POA: Diagnosis not present

## 2016-06-30 DIAGNOSIS — R1084 Generalized abdominal pain: Secondary | ICD-10-CM

## 2016-06-30 DIAGNOSIS — N3001 Acute cystitis with hematuria: Secondary | ICD-10-CM

## 2016-06-30 DIAGNOSIS — E119 Type 2 diabetes mellitus without complications: Secondary | ICD-10-CM

## 2016-06-30 DIAGNOSIS — R1013 Epigastric pain: Secondary | ICD-10-CM

## 2016-06-30 LAB — POCT GLYCOSYLATED HEMOGLOBIN (HGB A1C): HEMOGLOBIN A1C: 8

## 2016-06-30 MED ORDER — AMOXICILLIN 500 MG PO CAPS: 1000.0000 mg | ORAL_CAPSULE | Freq: Two times a day (BID) | ORAL | 0 refills | Status: AC

## 2016-06-30 MED ORDER — FAMOTIDINE 40 MG PO TABS: 40.0000 mg | ORAL_TABLET | Freq: Every day | ORAL | 5 refills | Status: DC

## 2016-06-30 MED ORDER — HYOSCYAMINE SULFATE 0.125 MG SL SUBL: SUBLINGUAL_TABLET | SUBLINGUAL | 2 refills | Status: DC

## 2016-06-30 NOTE — Progress Notes (Signed)
Patient: Casey Reese Female    DOB: 1990-11-02   26 y.o.   MRN: [id number] Visit Date: 06/30/2016  Today's Provider: Lelon Huh, MD   Chief Complaint  Patient presents with  . Diabetes  . Hypertension  . Abdominal Pain   Subjective:    HPI  Diabetes Mellitus Type II, Follow-up:   Lab Results  Component Value Date   HGBA1C 8.0 06/30/2016   HGBA1C 8.9 03/17/2016   HGBA1C 8.8 (H) 07/09/2015    Last seen for diabetes 3 months ago.  Management since then includes increased metformin to twice daily. She reports fair compliance with treatment. She is taking 2 tablet once daily She is not having side effects.  Current symptoms include nausea and vomitting and have been stable. Home blood sugar records: not being checked  Episodes of hypoglycemia? no  Pertinent Labs:    Component Value Date/Time   CHOL 134 07/09/2015 1116   TRIG 50 07/09/2015 1116   HDL 46 07/09/2015 1116   LDLCALC 78 07/09/2015 1116   CREATININE 0.68 06/28/2016 1135   CREATININE 0.74 04/01/2014 1725    Wt Readings from Last 3 Encounters:  06/30/16 272 lb (123.4 kg)  06/28/16 282 lb (127.9 kg)  06/26/16 282 lb 3.2 oz (128 kg)    ------------------------------------------------------------------------   Hypertension, follow-up:  BP Readings from Last 3 Encounters:  06/30/16 124/80  06/28/16 108/69  06/26/16 108/70    She was last seen for hypertension 3 months ago.  BP at that visit was 132/88. Management since that visit includes none. She reports good compliance with treatment. She is not having side effects.  Outside blood pressures are not being checked. She is experiencing none.  Patient denies chest pain, chest pressure/discomfort, claudication, dyspnea, exertional chest pressure/discomfort, irregular heart beat, lower extremity edema, near-syncope, orthopnea, palpitations, paroxysmal nocturnal dyspnea and syncope.    Wt Readings from Last 3 Encounters:  06/30/16 272  lb (123.4 kg)  06/28/16 282 lb (127.9 kg)  06/26/16 282 lb 3.2 oz (128 kg)   ------------------------------------------------------------------------ Abdominal pain- pt reports that she has had this abdominal pain or about a year. It improved, then over the last 2 weeks it has gotten worse again. The pain is located in the center of her stomach. She reports that she is getting over the flu, she's still taking Tamiflu. She also had vomited a few times even prior to getting the flu. Pt has been taking Carafate and either Pepcid or Zantac for this. She was in ER on 3/3 2018 for abdominal. There were RBC and WBC on u/a.    Basic metabolic panel     Status: Abnormal   Collection Time: 06/28/16 11:35 AM  Result Value Ref Range   Sodium 136 135 - 145 mmol/L   Potassium 3.5 3.5 - 5.1 mmol/L   Chloride 102 101 - 111 mmol/L   CO2 24 22 - 32 mmol/L   Glucose, Bld 182 (H) 65 - 99 mg/dL   BUN 5 (L) 6 - 20 mg/dL   Creatinine, Ser 0.68 0.44 - 1.00 mg/dL   Calcium 8.6 (L) 8.9 - 10.3 mg/dL   GFR calc non Af Amer >60 >60 mL/min   GFR calc Af Amer >60 >60 mL/min    Comment: (NOTE) The eGFR has been calculated using the CKD EPI equation. This calculation has not been validated in all clinical situations. eGFR's persistently <60 mL/min signify possible Chronic Kidney Disease.    Anion gap 10 5 -  15  CBC     Status: Abnormal   Collection Time: 06/28/16 11:35 AM  Result Value Ref Range   WBC 4.7 3.6 - 11.0 K/uL   RBC 4.84 3.80 - 5.20 MIL/uL   Hemoglobin 12.0 12.0 - 16.0 g/dL   HCT 36.7 35.0 - 47.0 %   MCV 75.8 (L) 80.0 - 100.0 fL   MCH 24.7 (L) 26.0 - 34.0 pg   MCHC 32.6 32.0 - 36.0 g/dL   RDW 16.0 (H) 11.5 - 14.5 %   Platelets 296 150 - 440 K/uL  Troponin I     Status: None   Collection Time: 06/28/16 11:35 AM  Result Value Ref Range   Troponin I <0.03 <0.03 ng/mL      Allergies  Allergen Reactions  . Sulfa Antibiotics      Current Outpatient Prescriptions:  .  acetaminophen (TYLENOL)  500 MG tablet, One or two 3 x day for aches, Disp: 30 tablet, Rfl: 0 .  albuterol (PROVENTIL HFA) 108 (90 Base) MCG/ACT inhaler, Inhale 2 puffs into the lungs every 6 (six) hours as needed., Disp: 18 g, Rfl: 3 .  chlorthalidone (HYGROTON) 25 MG tablet, Take 1 tablet (25 mg total) by mouth daily., Disp: 90 tablet, Rfl: 1 .  diltiazem (CARTIA XT) 120 MG 24 hr capsule, TAKE 1 CAPSULE BY MOUTH EVERY DAY, Disp: 30 capsule, Rfl: 3 .  famotidine (PEPCID) 40 MG tablet, Take 1 tablet (40 mg total) by mouth daily., Disp: 30 tablet, Rfl: 5 .  ibuprofen (ADVIL,MOTRIN) 600 MG tablet, Take 1 tablet (600 mg total) by mouth every 8 (eight) hours as needed., Disp: 30 tablet, Rfl: 0 .  metFORMIN (GLUCOPHAGE-XR) 500 MG 24 hr tablet, Take two tablets daily, Disp: 180 tablet, Rfl: 1 .  oseltamivir (TAMIFLU) 75 MG capsule, Take 1 capsule (75 mg total) by mouth 2 (two) times daily., Disp: 10 capsule, Rfl: 0 .  sucralfate (CARAFATE) 1 g tablet, TAKE 1 TABLET BY MOUTH FOUR TIMES DAILY EVERY NIGHT AT BEDTIME WITH MEALS, Disp: 360 tablet, Rfl: 1 .  benzonatate (TESSALON) 100 MG capsule, Take 1 capsule (100 mg total) by mouth 2 (two) times daily as needed for cough. (Patient not taking: Reported on 06/30/2016), Disp: 20 capsule, Rfl: 0 .  hyoscyamine (LEVSIN SL) 0.125 MG SL tablet, DISSOLVE 1 TABLET(0.125 MG) UNDER THE TONGUE EVERY 4 HOURS AS NEEDED FOR ABDOMINAL CRAMPS, Disp: 90 tablet, Rfl: 2 .  meclizine (ANTIVERT) 25 MG tablet, Take 1 tablet (25 mg total) by mouth 3 (three) times daily as needed for dizziness., Disp: 30 tablet, Rfl: 0 .  nortriptyline (PAMELOR) 25 MG capsule, TAKE 1 CAPSULE(25 MG) BY MOUTH AT BEDTIME (Patient not taking: Reported on 06/30/2016), Disp: 90 capsule, Rfl: 0 .  ondansetron (ZOFRAN ODT) 4 MG disintegrating tablet, Take 1 tablet (4 mg total) by mouth every 8 (eight) hours as needed for nausea or vomiting. (Patient not taking: Reported on 06/26/2016), Disp: 20 tablet, Rfl: 0  Review of Systems    Constitutional: Negative.   HENT: Negative.   Eyes: Negative.   Respiratory: Positive for cough.   Cardiovascular: Negative.   Gastrointestinal: Positive for abdominal pain, diarrhea, nausea and vomiting.  Endocrine: Negative.   Genitourinary: Negative.   Musculoskeletal: Negative.   Skin: Negative.   Allergic/Immunologic: Negative.   Neurological: Negative.   Hematological: Negative.   Psychiatric/Behavioral: Negative.    Was diagnosed with flu B last week and is on last dose of Tamiflu. .  Social History  Substance Use  Topics  . Smoking status: Current Some Day Smoker    Types: Cigarettes  . Smokeless tobacco: Never Used  . Alcohol use No   Objective:   BP 124/80 (BP Location: Right Arm, Patient Position: Sitting, Cuff Size: Large)   Pulse 86   Temp 97.9 F (36.6 C) (Oral)   Resp 18   Wt 272 lb (123.4 kg)   LMP 06/17/2016 (Exact Date)   SpO2 98%   BMI 43.90 kg/m  Vitals:   06/30/16 1128  BP: 124/80  Pulse: 86  Resp: 18  Temp: 97.9 F (36.6 C)  TempSrc: Oral  SpO2: 98%  Weight: 272 lb (123.4 kg)     Physical Exam  General Appearance:    Alert, cooperative, no distress, obese  Eyes:    PERRL, conjunctiva/corneas clear, EOM's intact       Lungs:     Clear to auscultation bilaterally, respirations unlabored  Heart:    Regular rate and rhythm  Neurologic:   Awake, alert, oriented x 3. No apparent focal neurological           defect.   Abd:   Moderate epigastric tenderness.    Results for orders placed or performed in visit on 06/30/16  POCT HgB A1C  Result Value Ref Range   Hemoglobin A1C 8.0        Assessment & Plan:     1. Type 2 diabetes mellitus without complication, without long-term current use of insulin (HCC) Improving, but she admits to frequently missing second dose of metformin. She is going to start taking BOTH metformin at the same time.  - POCT HgB A1C  2. Essential hypertension Well controlled.  Continue current medications.    3.  Epigastric pain She does have history of h.pylori. She is taking Carafate now but out of famotidine which was refilled.  - famotidine (PEPCID) 40 MG tablet; Take 1 tablet (40 mg total) by mouth daily.  Dispense: 30 tablet; Refill: 5 - H. pylori breath test  4. Generalized abdominal pain Is also having some non-specific cramping. Will cover with hyoscyamine.  - hyoscyamine (LEVSIN SL) 0.125 MG SL tablet; DISSOLVE 1 TABLET(0.125 MG) UNDER THE TONGUE EVERY 4 HOURS AS NEEDED FOR ABDOMINAL CRAMPS  Dispense: 90 tablet; Refill: 2  5. Acute cystitis with hematuria Had positive u/a in Er. Will start on amoxicillin while awaiting h. Pylori test and recheck u/a when finished with antibiotics.        Lelon Huh, MD  [facility]

## 2016-07-02 LAB — H.PYLORI BREATH TEST (REFLEX): H. PYLORI BREATH TEST: NEGATIVE

## 2016-07-02 LAB — H. PYLORI BREATH TEST

## 2016-07-30 ENCOUNTER — Telehealth: Payer: Self-pay

## 2016-07-30 NOTE — Telephone Encounter (Signed)
Patient called to ask what would be good take for allergies. She does not take allergy medication and has not tried anything OTC. Symptoms started yesterday nasal congestion and runny nose, some sneezing, sore throat and itching in her throat, head pressure, eyes puffy, post nasal drip. No cough. NO fever, no chills. CB [phone]

## 2016-07-30 NOTE — Telephone Encounter (Signed)
Left message to call back  

## 2016-07-30 NOTE — Telephone Encounter (Signed)
She can take Claritin, Zyrtec, or Allegra OTC. Doesn't matter which one. Stay away from anything with "D" for decongestant on it because of her high blood pressure. Must take these daily.

## 2016-08-21 ENCOUNTER — Ambulatory Visit: Payer: Medicare Other | Admitting: Physician Assistant

## 2016-08-29 ENCOUNTER — Encounter: Payer: Self-pay | Admitting: Family Medicine

## 2016-08-29 ENCOUNTER — Ambulatory Visit (INDEPENDENT_AMBULATORY_CARE_PROVIDER_SITE_OTHER): Payer: Medicare Other | Admitting: Family Medicine

## 2016-08-29 VITALS — BP 132/70 | HR 104 | Temp 98.9°F | Resp 16 | Wt 273.0 lb

## 2016-08-29 DIAGNOSIS — K529 Noninfective gastroenteritis and colitis, unspecified: Secondary | ICD-10-CM

## 2016-08-29 MED ORDER — PROMETHAZINE HCL 25 MG PO TABS: 25.0000 mg | ORAL_TABLET | Freq: Three times a day (TID) | ORAL | 0 refills | Status: DC | PRN

## 2016-08-29 MED ORDER — DICYCLOMINE HCL 10 MG PO CAPS: 10.0000 mg | ORAL_CAPSULE | Freq: Three times a day (TID) | ORAL | 0 refills | Status: DC | PRN

## 2016-08-29 NOTE — Progress Notes (Signed)
Patient: Casey Reese Female    DOB: 1990-10-15   26 y.o.   MRN: [id number] Visit Date: 08/29/2016  Today's Provider: Mila Merryonald Fisher, MD   Chief Complaint  Patient presents with  . Abdominal Pain   Subjective:    Abdominal Pain  This is a new problem. The current episode started yesterday. The onset quality is sudden. The quality of the pain is a sensation of fullness, sharp and aching. The abdominal pain radiates to the LLQ. Associated symptoms include belching, diarrhea, nausea and vomiting. Nothing aggravates the pain. She has tried acetaminophen for the symptoms. The treatment provided no relief.   Patient reports that her vomit has a "sulfur" smell to it. Started as left upper quadrant abdominal pain 2 nights ago soon followed by vomiting. Developed diarrhea the next day. States has had multiple household exposures with similar symptoms. Holds down small amounts of clear liquid but nothing else. Pain is cramping, not burning. No blood in emesis or stool.     Allergies  Allergen Reactions  . Sulfa Antibiotics      Current Outpatient Prescriptions:  .  acetaminophen (TYLENOL) 500 MG tablet, One or two 3 x day for aches, Disp: 30 tablet, Rfl: 0 .  albuterol (PROVENTIL HFA) 108 (90 Base) MCG/ACT inhaler, Inhale 2 puffs into the lungs every 6 (six) hours as needed., Disp: 18 g, Rfl: 3 .  chlorthalidone (HYGROTON) 25 MG tablet, Take 1 tablet (25 mg total) by mouth daily., Disp: 90 tablet, Rfl: 1 .  diltiazem (CARTIA XT) 120 MG 24 hr capsule, TAKE 1 CAPSULE BY MOUTH EVERY DAY, Disp: 30 capsule, Rfl: 3 .  famotidine (PEPCID) 40 MG tablet, Take 1 tablet (40 mg total) by mouth daily., Disp: 30 tablet, Rfl: 5 .  hyoscyamine (LEVSIN SL) 0.125 MG SL tablet, DISSOLVE 1 TABLET(0.125 MG) UNDER THE TONGUE EVERY 4 HOURS AS NEEDED FOR ABDOMINAL CRAMPS, Disp: 90 tablet, Rfl: 2 .  ibuprofen (ADVIL,MOTRIN) 600 MG tablet, Take 1 tablet (600 mg total) by mouth every 8 (eight) hours as needed.,  Disp: 30 tablet, Rfl: 0 .  meclizine (ANTIVERT) 25 MG tablet, Take 1 tablet (25 mg total) by mouth 3 (three) times daily as needed for dizziness., Disp: 30 tablet, Rfl: 0 .  metFORMIN (GLUCOPHAGE-XR) 500 MG 24 hr tablet, Take two tablets daily, Disp: 180 tablet, Rfl: 1 .  nortriptyline (PAMELOR) 25 MG capsule, TAKE 1 CAPSULE(25 MG) BY MOUTH AT BEDTIME (Patient not taking: Reported on 06/30/2016), Disp: 90 capsule, Rfl: 0 .  ondansetron (ZOFRAN ODT) 4 MG disintegrating tablet, Take 1 tablet (4 mg total) by mouth every 8 (eight) hours as needed for nausea or vomiting. (Patient not taking: Reported on 06/26/2016), Disp: 20 tablet, Rfl: 0 .  oseltamivir (TAMIFLU) 75 MG capsule, Take 1 capsule (75 mg total) by mouth 2 (two) times daily. (Patient not taking: Reported on 08/29/2016), Disp: 10 capsule, Rfl: 0 .  sucralfate (CARAFATE) 1 g tablet, TAKE 1 TABLET BY MOUTH FOUR TIMES DAILY EVERY NIGHT AT BEDTIME WITH MEALS (Patient not taking: Reported on 08/29/2016), Disp: 360 tablet, Rfl: 1  Review of Systems  Gastrointestinal: Positive for abdominal pain, diarrhea, nausea and vomiting.    Social History  Substance Use Topics  . Smoking status: Current Some Day Smoker    Types: Cigarettes  . Smokeless tobacco: Never Used  . Alcohol use No   Objective:   BP 132/70 (BP Location: Left Arm, Patient Position: Sitting, Cuff Size: Large)  Pulse (!) 104   Temp 98.9 F (37.2 C)   Resp 16   Wt 273 lb (123.8 kg)   SpO2 98%   BMI 44.06 kg/m  Vitals:   08/29/16 1615  BP: 132/70  Pulse: (!) 104  Resp: 16  Temp: 98.9 F (37.2 C)  SpO2: 98%  Weight: 273 lb (123.8 kg)     Physical Exam  General Appearance:    Alert, cooperative, no distress  Eyes:    PERRL, conjunctiva/corneas clear, EOM's intact       Lungs:     Clear to auscultation bilaterally, respirations unlabored  Heart:    Regular rate and rhythm  Abdomen:   Hypoactive bowel sounds. Diffuse tenderness more so in left upper quadrant. No rebound,  no guarding.         Assessment & Plan:     1. Gastroenteritis Counseled on bland diet.  - promethazine (PHENERGAN) 25 MG tablet; Take 1 tablet (25 mg total) by mouth every 8 (eight) hours as needed for nausea or vomiting.  Dispense: 12 tablet; Refill: 0 - dicyclomine (BENTYL) 10 MG capsule; Take 1 capsule (10 mg total) by mouth 3 (three) times daily as needed for spasms.  Dispense: 20 capsule; Refill: 0  Call if symptoms change or if not rapidly improving.          Mila Merry, MD  [facility]

## 2016-08-29 NOTE — Patient Instructions (Signed)

## 2016-09-01 ENCOUNTER — Encounter: Payer: Self-pay | Admitting: Family Medicine

## 2016-10-22 ENCOUNTER — Ambulatory Visit: Payer: Medicare Other | Admitting: Physician Assistant

## 2016-11-21 ENCOUNTER — Ambulatory Visit: Payer: Medicare Other | Admitting: Family Medicine

## 2017-01-29 ENCOUNTER — Emergency Department
Admission: EM | Admit: 2017-01-29 | Discharge: 2017-01-29 | Disposition: A | Discharge status: Home / Self Care | Payer: Medicare Other | Attending: Emergency Medicine | Admitting: Emergency Medicine

## 2017-01-29 ENCOUNTER — Emergency Department: Payer: Medicare Other

## 2017-01-29 ENCOUNTER — Encounter: Payer: Self-pay | Admitting: Emergency Medicine

## 2017-01-29 DIAGNOSIS — F17210 Nicotine dependence, cigarettes, uncomplicated: Secondary | ICD-10-CM | POA: Insufficient documentation

## 2017-01-29 DIAGNOSIS — E119 Type 2 diabetes mellitus without complications: Secondary | ICD-10-CM | POA: Insufficient documentation

## 2017-01-29 DIAGNOSIS — B9789 Other viral agents as the cause of diseases classified elsewhere: Secondary | ICD-10-CM

## 2017-01-29 DIAGNOSIS — Z7984 Long term (current) use of oral hypoglycemic drugs: Secondary | ICD-10-CM | POA: Diagnosis not present

## 2017-01-29 DIAGNOSIS — J029 Acute pharyngitis, unspecified: Secondary | ICD-10-CM | POA: Diagnosis present

## 2017-01-29 DIAGNOSIS — I10 Essential (primary) hypertension: Secondary | ICD-10-CM | POA: Insufficient documentation

## 2017-01-29 DIAGNOSIS — R509 Fever, unspecified: Secondary | ICD-10-CM | POA: Insufficient documentation

## 2017-01-29 DIAGNOSIS — R05 Cough: Secondary | ICD-10-CM | POA: Diagnosis not present

## 2017-01-29 DIAGNOSIS — J069 Acute upper respiratory infection, unspecified: Secondary | ICD-10-CM | POA: Diagnosis not present

## 2017-01-29 DIAGNOSIS — R52 Pain, unspecified: Secondary | ICD-10-CM | POA: Diagnosis not present

## 2017-01-29 LAB — INFLUENZA PANEL BY PCR (TYPE A & B)
Influenza A By PCR: NEGATIVE
Influenza B By PCR: NEGATIVE

## 2017-01-29 LAB — POCT RAPID STREP A: STREPTOCOCCUS, GROUP A SCREEN (DIRECT): NEGATIVE

## 2017-01-29 MED ORDER — DEXAMETHASONE SODIUM PHOSPHATE 10 MG/ML IJ SOLN
10.0000 mg | Freq: Once | INTRAMUSCULAR | Status: AC
  Administered 2017-01-29: 10 mg via INTRAMUSCULAR
  Filled 2017-01-29: qty 1

## 2017-01-29 MED ORDER — ACETAMINOPHEN 500 MG PO TABS
1000.0000 mg | ORAL_TABLET | Freq: Once | ORAL | Status: AC
  Administered 2017-01-29: 1000 mg via ORAL
  Filled 2017-01-29: qty 2

## 2017-01-29 MED ORDER — ACETAMINOPHEN-CODEINE #3 300-30 MG PO TABS: 1.0000 | ORAL_TABLET | Freq: Three times a day (TID) | ORAL | 0 refills | Status: DC | PRN

## 2017-01-29 MED ORDER — BENZONATATE 100 MG PO CAPS: 100.0000 mg | ORAL_CAPSULE | Freq: Three times a day (TID) | ORAL | 0 refills | Status: DC | PRN

## 2017-01-29 MED ORDER — PANTOPRAZOLE SODIUM 40 MG PO TBEC
40.0000 mg | DELAYED_RELEASE_TABLET | Freq: Every day | ORAL | Status: DC
  Administered 2017-01-29: 40 mg via ORAL

## 2017-01-29 MED ORDER — METOCLOPRAMIDE HCL 10 MG PO TABS
10.0000 mg | ORAL_TABLET | Freq: Once | ORAL | Status: AC
  Administered 2017-01-29: 10 mg via ORAL
  Filled 2017-01-29: qty 1

## 2017-01-29 MED ORDER — PANTOPRAZOLE SODIUM 40 MG PO TBEC
DELAYED_RELEASE_TABLET | ORAL | Status: AC
  Filled 2017-01-29: qty 1

## 2017-01-29 MED ORDER — FLUTICASONE PROPIONATE 50 MCG/ACT NA SUSP: 2.0000 | Freq: Every day | NASAL | 0 refills | Status: DC

## 2017-01-29 NOTE — ED Provider Notes (Signed)
[facility] Emergency Department Provider Note ____________________________________________  Time seen: 55  I have reviewed the triage vital signs and the nursing notes.  HISTORY  Chief Complaint  Sore Throat  HPI KARLIE AUNG is a 26 y.o. female presents to the ED for a 2 day complaint of cough, congestion, and sore throat. She also reports a fever today. Patient also notes generalized bodyaches. She did not receive the flu vaccine for the season, and did have influenza last season. She denies any recent travel, sick contacts, or other exposures.  Past Medical History:  Diagnosis Date  . Diabetes mellitus without complication (HCC)   . History of chicken pox   . Hypertension   . S/P above knee amputation (HCC)   . 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 GERD (gastroesophageal reflux disease) 05/23/2016  . Helicobacter pylori gastritis 07/09/2015  . Essential hypertension 06/29/2015  . Diabetes mellitus (HCC) 06/04/2015  . Morbid obesity (HCC) 01/18/2008  . 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 ABOVE KNEE LEG AMPUTATION Left 2003  . HIP FUSION Left     Prior to Admission medications   Medication Sig Start Date End Date Taking? Authorizing Provider  acetaminophen (TYLENOL) 500 MG tablet One or two 3 x day for aches 06/26/16   Anola Gurney, PA  acetaminophen-codeine (TYLENOL #3) 300-30 MG tablet Take 1 tablet by mouth every 8 (eight) hours as needed for moderate pain. 01/29/17   Rondel Episcopo, Charlesetta Ivory, PA-C  albuterol (PROVENTIL HFA) 108 (90 Base) MCG/ACT inhaler Inhale 2 puffs into the lungs every 6 (six) hours as needed. 06/28/15   Malva Limes, MD  benzonatate (TESSALON PERLES) 100 MG capsule Take 1 capsule (100 mg total) by mouth 3  (three) times daily as needed for cough (Take 1-2 per dose). 01/29/17   Devontay Celaya, Charlesetta Ivory, PA-C  chlorthalidone (HYGROTON) 25 MG tablet Take 1 tablet (25 mg total) by mouth daily. 03/17/16   Malva Limes, MD  dicyclomine (BENTYL) 10 MG capsule Take 1 capsule (10 mg total) by mouth 3 (three) times daily as needed for spasms. 08/29/16 09/28/16  Malva Limes, MD  diltiazem (CARTIA XT) 120 MG 24 hr capsule TAKE 1 CAPSULE BY MOUTH EVERY DAY 03/05/16   Malva Limes, MD  famotidine (PEPCID) 40 MG tablet Take 1 tablet (40 mg total) by mouth daily. 06/30/16   Malva Limes, MD  fluticasone (FLONASE) 50 MCG/ACT nasal spray Place 2 sprays into both nostrils daily. 01/29/17   Sadey Yandell, Charlesetta Ivory, PA-C  hyoscyamine (LEVSIN SL) 0.125 MG SL tablet DISSOLVE 1 TABLET(0.125 MG) UNDER THE TONGUE EVERY 4 HOURS AS NEEDED FOR ABDOMINAL CRAMPS 06/30/16   Malva Limes, MD  ibuprofen (ADVIL,MOTRIN) 600 MG tablet Take 1 tablet (600 mg total) by mouth every 8 (eight) hours as needed. 06/28/16   Merrily Brittle, MD  meclizine (ANTIVERT) 25 MG tablet Take 1 tablet (25 mg total) by mouth 3 (three) times daily as needed for dizziness. 08/20/15   Malva Limes, MD  metFORMIN (GLUCOPHAGE-XR) 500 MG 24 hr tablet Take two tablets daily 03/17/16   Malva Limes, MD  promethazine (PHENERGAN) 25 MG tablet Take 1 tablet (25 mg total) by mouth every 8 (eight) hours as needed for nausea or  vomiting. 08/29/16 09/08/16  Malva Limes, MD    Allergies Sulfa antibiotics  Family History  Problem Relation Age of Onset  . Diabetes Mother        type 2    Social History Social History  Substance Use Topics  . Smoking status: Current Some Day Smoker    Types: Cigarettes  . Smokeless tobacco: Never Used  . Alcohol use No    Review of Systems  Constitutional: positive for fever. Eyes: Negative for visual changes. ENT: positive for sore throat. Cardiovascular: Negative for chest pain. Respiratory: Negative for  shortness of breath. Reports cough and congestion. Gastrointestinal: Negative for abdominal pain, vomiting and diarrhea. Genitourinary: Negative for dysuria. Musculoskeletal: Negative for back pain. Skin: Negative for rash. Neurological: Negative for headaches, focal weakness or numbness. ____________________________________________  PHYSICAL EXAM:  VITAL SIGNS: ED Triage Vitals  Enc Vitals Group     BP 01/29/17 1915 (!) 163/87     Pulse Rate 01/29/17 1915 (!) 112     Resp 01/29/17 1915 20     Temp 01/29/17 1914 98.3 F (36.8 C)     Temp Source 01/29/17 1914 Oral     SpO2 01/29/17 1915 100 %     Weight 01/29/17 1916 270 lb (122.5 kg)     Height 01/29/17 1916  (1.676 m)     Head Circumference --      Peak Flow --      Pain Score 01/29/17 1914 9     Pain Loc --      Pain Edu? --      Excl. in GC? --     Constitutional: Alert and oriented. Well appearing and in no distress. Head: Normocephalic and atraumatic. Eyes: Conjunctivae are normal. PERRL. Normal extraocular movements Ears: Canals clear. TMs intact bilaterally. Nose: No congestion/rhinorrhea/epistaxis. Mouth/Throat: Mucous membranes are moist. Uvula is midline and tonsils are flat. No oropharyngeal lesions or exudate are noted. Neck: Supple. No thyromegaly. Hematological/Lymphatic/Immunological: No cervical lymphadenopathy. Cardiovascular: Normal rate, regular rhythm. Normal distal pulses. Respiratory: Normal respiratory effort. No wheezes/rales/rhonchi. Gastrointestinal: Soft and nontender. No distention. ____________________________________________   LABS (pertinent positives/negatives)  Labs Reviewed  CULTURE, GROUP A STREP ([facility])  INFLUENZA PANEL BY PCR (TYPE A & B)  POCT RAPID STREP A  ____________________________________________  RADIOLOGY  CXR  IMPRESSION: No active cardiopulmonary disease. ______________________________________________  PROCEDURES  Decadron 10 mg IM Tylenol 1000 mg  PO Reglan 10 mg PO Protonix 40 mg PO ____________________________________________  INITIAL IMPRESSION / ASSESSMENT AND PLAN / ED COURSE  DDX: CAP, mono, URI, Influenza  atient with ED evaluation of multiple complaints including sore throat, body aches, cough, congestion and fever. Her exam is overall benign and she is reassured by her negative flu swab, rapid strep culture, and chest x-ray. Her symptoms likely represent a viral etiology for a URI. A throat culture is pending at the time of discharge. Patient is discharged with prescriptions for Tylenol No. 3, Tessalon Perles, and Flonase. She may dose over-the-counter cough medicine for additional relief. She is advised to follow-up with her primary care provider for ongoing symptoms return to the ED for acutely worsening symptoms as discussed. ____________________________________________  FINAL CLINICAL IMPRESSION(S) / ED DIAGNOSES  Final diagnoses:  Viral URI with cough      Coraima Tibbs, Charlesetta Ivory, PA-C 01/29/17 2204    Rockne Menghini, MD 01/29/17 2342

## 2017-01-29 NOTE — ED Notes (Signed)
Patient transported to X-ray 

## 2017-01-29 NOTE — Discharge Instructions (Signed)
Your exam, labs, and chest x-ray are negative for pneumonia or influenza. Continue to monitor and treat symptoms. Follow-up with your provider or return as needed. Take the prescription meds as directed. Rest and drink plenty of fluids.

## 2017-01-29 NOTE — ED Triage Notes (Signed)
Pt in with co sore throat, body aches, congestion, and cough. States has had a fever at home.

## 2017-02-01 LAB — CULTURE, GROUP A STREP (THRC)

## 2017-02-02 ENCOUNTER — Emergency Department
Admission: EM | Admit: 2017-02-02 | Discharge: 2017-02-02 | Disposition: A | Discharge status: Home / Self Care | Payer: Medicare Other | Attending: Emergency Medicine | Admitting: Emergency Medicine

## 2017-02-02 ENCOUNTER — Emergency Department: Payer: Medicare Other

## 2017-02-02 ENCOUNTER — Telehealth: Payer: Self-pay

## 2017-02-02 ENCOUNTER — Encounter: Payer: Self-pay | Admitting: *Deleted

## 2017-02-02 DIAGNOSIS — J069 Acute upper respiratory infection, unspecified: Secondary | ICD-10-CM

## 2017-02-02 DIAGNOSIS — F17210 Nicotine dependence, cigarettes, uncomplicated: Secondary | ICD-10-CM | POA: Diagnosis not present

## 2017-02-02 DIAGNOSIS — M542 Cervicalgia: Secondary | ICD-10-CM | POA: Diagnosis not present

## 2017-02-02 DIAGNOSIS — R079 Chest pain, unspecified: Secondary | ICD-10-CM | POA: Insufficient documentation

## 2017-02-02 DIAGNOSIS — I10 Essential (primary) hypertension: Secondary | ICD-10-CM | POA: Diagnosis not present

## 2017-02-02 DIAGNOSIS — R05 Cough: Secondary | ICD-10-CM | POA: Diagnosis not present

## 2017-02-02 LAB — BASIC METABOLIC PANEL
ANION GAP: 8 (ref 5–15)
BUN: 14 mg/dL (ref 6–20)
CALCIUM: 9.2 mg/dL (ref 8.9–10.3)
CHLORIDE: 103 mmol/L (ref 101–111)
CO2: 25 mmol/L (ref 22–32)
Creatinine, Ser: 0.76 mg/dL (ref 0.44–1.00)
GFR calc non Af Amer: >60 mL/min (ref >60)
GLUCOSE: 127 mg/dL — AB (ref 65–99)
Potassium: 3.5 mmol/L (ref 3.5–5.1)
Sodium: 136 mmol/L (ref 135–145)

## 2017-02-02 LAB — CBC
HCT: 36.6 % (ref 35.0–47.0)
HEMOGLOBIN: 11.9 g/dL — AB (ref 12.0–16.0)
MCH: 24.3 pg — AB (ref 26.0–34.0)
MCHC: 32.6 g/dL (ref 32.0–36.0)
MCV: 74.6 fL — AB (ref 80.0–100.0)
Platelets: 398 10*3/uL (ref 150–440)
RBC: 4.9 MIL/uL (ref 3.80–5.20)
RDW: 15.9 % — ABNORMAL HIGH (ref 11.5–14.5)
WBC: 11.1 10*3/uL — ABNORMAL HIGH (ref 3.6–11.0)

## 2017-02-02 LAB — TROPONIN I

## 2017-02-02 NOTE — ED Notes (Signed)
Pt discharged to home.  Family member driving.  Discharge instructions reviewed.  Verbalized understanding.  No questions or concerns at this time.  Teach back verified.  Pt in NAD.  No items left in ED.   

## 2017-02-02 NOTE — ED Provider Notes (Signed)
Lurline Idol, attending physician, personally viewed and interpreted this EKG  EKG Time: 1928  Rate: 91 Rhythm: normal sinus rhythm Axis: normal Intervals: qtc 455 QRS: narrow ST changes: no st elevation Impression: normal Maryan Puls, MD 02/02/17 1935

## 2017-02-02 NOTE — Telephone Encounter (Signed)
Pt called in complaining of Chest pain and shortness of breath.  She states that was in the ER 01/29/2017 for the same symptoms.  She was tested for pneumonia and flu, both tests where negative.  She was diagnosed with a Viral URI.   She states her chief complaint is chest pain and shortness of breath today.  I advised her that with her symptoms she will need to go back to the ER to be evaluated.  She refused stating that the only reason she is having pain and SOB is because she was sick and she just "Needed to see my doctor."  I put her on the schedule for 1:45 02/03/2017  Thanks,   -Vernona Rieger

## 2017-02-02 NOTE — ED Triage Notes (Signed)
Pt reports a productive cough and neck pain.  Sx for 6 days.  No fever.  cig smoker.  states coughing up green/yellow phlegm   Pt alert.

## 2017-02-02 NOTE — ED Notes (Signed)
Pt reporting chest pain, states that pain is worse with palpation to chest.  Pt states her cough has subsided some, but that her chest pain remains.  Pt reports that chest pain started shortly after cough started.

## 2017-02-03 ENCOUNTER — Ambulatory Visit (INDEPENDENT_AMBULATORY_CARE_PROVIDER_SITE_OTHER): Payer: Medicare Other | Admitting: Family Medicine

## 2017-02-03 ENCOUNTER — Encounter: Payer: Self-pay | Admitting: Family Medicine

## 2017-02-03 VITALS — BP 128/82 | HR 80 | Temp 98.2°F | Resp 16 | Wt 270.0 lb

## 2017-02-03 DIAGNOSIS — K625 Hemorrhage of anus and rectum: Secondary | ICD-10-CM

## 2017-02-03 DIAGNOSIS — J069 Acute upper respiratory infection, unspecified: Secondary | ICD-10-CM | POA: Diagnosis not present

## 2017-02-03 DIAGNOSIS — R05 Cough: Secondary | ICD-10-CM | POA: Diagnosis not present

## 2017-02-03 DIAGNOSIS — R059 Cough, unspecified: Secondary | ICD-10-CM

## 2017-02-03 MED ORDER — ALBUTEROL SULFATE HFA 108 (90 BASE) MCG/ACT IN AERS: 2.0000 | INHALATION_SPRAY | Freq: Four times a day (QID) | RESPIRATORY_TRACT | 3 refills | Status: DC | PRN

## 2017-02-03 MED ORDER — PREDNISONE 10 MG PO TABS: ORAL_TABLET | ORAL | 0 refills | Status: AC

## 2017-02-03 MED ORDER — BLOOD GLUCOSE MONITOR KIT: PACK | 0 refills | Status: DC

## 2017-02-03 MED ORDER — AZITHROMYCIN 250 MG PO TABS: ORAL_TABLET | ORAL | 0 refills | Status: AC

## 2017-02-03 NOTE — Progress Notes (Signed)
Patient: Casey Reese Female    DOB: 1991/01/18   26 y.o.   MRN: [id number] Visit Date: 02/03/2017  Today's Provider: Mila Merry, MD   Chief Complaint  Patient presents with  . Hospitalization Follow-up   Subjective:    HPI  Follow Up ER Visit  Patient is here for ER follow up.  She was recently seen at [facility] for URI on 01/29/17 and 02/02/17. Treatment for this included Tessalon, Tylenol #3, and Flonase.  She reports good compliance with treatment. She reports this condition is Unchanged.  Patient reports that she feels more tired than usual. She states that she usually does not feel this way when she has a cold.    She also reports several episodes of blood per rectum over the last 2-3 weeks. Is usually on toilet paper when wiping. No rectal pain or masses. She states she is sure it is not vaginal bleeding. Denies constipation.     Allergies  Allergen Reactions  . Sulfa Antibiotics      Current Outpatient Prescriptions:  .  acetaminophen (TYLENOL) 500 MG tablet, One or two 3 x day for aches, Disp: 30 tablet, Rfl: 0 .  acetaminophen-codeine (TYLENOL #3) 300-30 MG tablet, Take 1 tablet by mouth every 8 (eight) hours as needed for moderate pain., Disp: 10 tablet, Rfl: 0 .  albuterol (PROVENTIL HFA) 108 (90 Base) MCG/ACT inhaler, Inhale 2 puffs into the lungs every 6 (six) hours as needed., Disp: 18 g, Rfl: 3 .  chlorthalidone (HYGROTON) 25 MG tablet, Take 1 tablet (25 mg total) by mouth daily., Disp: 90 tablet, Rfl: 1 .  diltiazem (CARTIA XT) 120 MG 24 hr capsule, TAKE 1 CAPSULE BY MOUTH EVERY DAY, Disp: 30 capsule, Rfl: 3 .  famotidine (PEPCID) 40 MG tablet, Take 1 tablet (40 mg total) by mouth daily., Disp: 30 tablet, Rfl: 5 .  fluticasone (FLONASE) 50 MCG/ACT nasal spray, Place 2 sprays into both nostrils daily., Disp: 16 g, Rfl: 0 .  hyoscyamine (LEVSIN SL) 0.125 MG SL tablet, DISSOLVE 1 TABLET(0.125 MG) UNDER THE TONGUE EVERY 4 HOURS AS NEEDED FOR ABDOMINAL  CRAMPS, Disp: 90 tablet, Rfl: 2 .  ibuprofen (ADVIL,MOTRIN) 600 MG tablet, Take 1 tablet (600 mg total) by mouth every 8 (eight) hours as needed., Disp: 30 tablet, Rfl: 0 .  meclizine (ANTIVERT) 25 MG tablet, Take 1 tablet (25 mg total) by mouth 3 (three) times daily as needed for dizziness., Disp: 30 tablet, Rfl: 0 .  metFORMIN (GLUCOPHAGE-XR) 500 MG 24 hr tablet, Take two tablets daily, Disp: 180 tablet, Rfl: 1 .  benzonatate (TESSALON PERLES) 100 MG capsule, Take 1 capsule (100 mg total) by mouth 3 (three) times daily as needed for cough (Take 1-2 per dose). (Patient not taking: Reported on 02/03/2017), Disp: 30 capsule, Rfl: 0 .  dicyclomine (BENTYL) 10 MG capsule, Take 1 capsule (10 mg total) by mouth 3 (three) times daily as needed for spasms., Disp: 20 capsule, Rfl: 0 .  promethazine (PHENERGAN) 25 MG tablet, Take 1 tablet (25 mg total) by mouth every 8 (eight) hours as needed for nausea or vomiting., Disp: 12 tablet, Rfl: 0  Review of Systems  Constitutional: Positive for chills, fatigue and fever. Negative for activity change.       Has not had fever since last night  HENT: Positive for congestion and postnasal drip.   Respiratory: Positive for cough and shortness of breath.   Neurological: Positive for headaches.    Social  History  Substance Use Topics  . Smoking status: Current Some Day Smoker    Types: Cigarettes  . Smokeless tobacco: Never Used  . Alcohol use 0.0 oz/week   Objective:   BP 128/82 (BP Location: Right Arm, Patient Position: Sitting, Cuff Size: Large)   Pulse 80   Temp 98.2 F (36.8 C)   Resp 16   Wt 270 lb (122.5 kg)   LMP 01/19/2017   SpO2 98%   BMI 43.58 kg/m  Vitals:   02/03/17 1353  BP: 128/82  Pulse: 80  Resp: 16  Temp: 98.2 F (36.8 C)  SpO2: 98%  Weight: 270 lb (122.5 kg)     Physical Exam  General Appearance:    Alert, cooperative, no distress  HENT:   bilateral TM normal without fluid or infection, sinuses nontender and frontal sinus  tender  Eyes:    PERRL, conjunctiva/corneas clear, EOM's intact       Lungs:     Clear to auscultation bilaterally, respirations unlabored  Heart:    Regular rate and rhythm  Neurologic:   Awake, alert, oriented x 3. No apparent focal neurological           defect.           Assessment & Plan:     1. Cough  - albuterol (PROVENTIL HFA) 108 (90 Base) MCG/ACT inhaler; Inhale 2 puffs into the lungs every 6 (six) hours as needed.  Dispense: 18 g; Refill: 3  2. Upper respiratory tract infection, unspecified type - azithromycin (ZITHROMAX) 250 MG tablet; 2 by mouth today, then 1 daily for 4 days  Dispense: 6 tablet; Refill: 0 - predniSONE (DELTASONE) 10 MG tablet; 6 tablets for 1 day, then 5 for 1 day, then 4 for 1 day, then 3 for 1 day, then 2 for 1 day then 1 for 1 day.  Dispense: 21 tablet; Refill: 0 Call if symptoms change or if not rapidly improving.     3. Blood per rectum  - Ambulatory referral to Gastroenterology        Mila Merry, MD  [facility]

## 2017-02-14 DIAGNOSIS — F17290 Nicotine dependence, other tobacco product, uncomplicated: Secondary | ICD-10-CM | POA: Diagnosis not present

## 2017-02-14 DIAGNOSIS — K219 Gastro-esophageal reflux disease without esophagitis: Secondary | ICD-10-CM | POA: Diagnosis not present

## 2017-02-14 DIAGNOSIS — R509 Fever, unspecified: Secondary | ICD-10-CM | POA: Diagnosis not present

## 2017-02-14 DIAGNOSIS — R000 Tachycardia, unspecified: Secondary | ICD-10-CM | POA: Diagnosis not present

## 2017-02-14 DIAGNOSIS — Z7984 Long term (current) use of oral hypoglycemic drugs: Secondary | ICD-10-CM | POA: Diagnosis not present

## 2017-02-14 DIAGNOSIS — R05 Cough: Secondary | ICD-10-CM | POA: Diagnosis not present

## 2017-02-14 DIAGNOSIS — I10 Essential (primary) hypertension: Secondary | ICD-10-CM | POA: Diagnosis not present

## 2017-02-14 DIAGNOSIS — E119 Type 2 diabetes mellitus without complications: Secondary | ICD-10-CM | POA: Diagnosis not present

## 2017-02-14 DIAGNOSIS — J189 Pneumonia, unspecified organism: Secondary | ICD-10-CM | POA: Diagnosis not present

## 2017-02-14 DIAGNOSIS — R112 Nausea with vomiting, unspecified: Secondary | ICD-10-CM | POA: Diagnosis not present

## 2017-02-14 DIAGNOSIS — R0981 Nasal congestion: Secondary | ICD-10-CM | POA: Diagnosis not present

## 2017-02-14 DIAGNOSIS — R5383 Other fatigue: Secondary | ICD-10-CM | POA: Diagnosis not present

## 2017-02-14 DIAGNOSIS — Z79899 Other long term (current) drug therapy: Secondary | ICD-10-CM | POA: Diagnosis not present

## 2017-02-14 DIAGNOSIS — R51 Headache: Secondary | ICD-10-CM | POA: Diagnosis not present

## 2017-02-17 ENCOUNTER — Ambulatory Visit: Payer: Medicare Other | Admitting: Family Medicine

## 2017-03-06 ENCOUNTER — Ambulatory Visit (INDEPENDENT_AMBULATORY_CARE_PROVIDER_SITE_OTHER): Payer: Medicare Other | Admitting: Family Medicine

## 2017-03-06 ENCOUNTER — Encounter: Payer: Self-pay | Admitting: Family Medicine

## 2017-03-06 VITALS — BP 138/90 | HR 106 | Temp 98.7°F | Resp 16 | Wt 279.0 lb

## 2017-03-06 DIAGNOSIS — E119 Type 2 diabetes mellitus without complications: Secondary | ICD-10-CM

## 2017-03-06 DIAGNOSIS — I10 Essential (primary) hypertension: Secondary | ICD-10-CM | POA: Diagnosis not present

## 2017-03-06 DIAGNOSIS — N938 Other specified abnormal uterine and vaginal bleeding: Secondary | ICD-10-CM

## 2017-03-06 LAB — POCT GLYCOSYLATED HEMOGLOBIN (HGB A1C)
Est. average glucose Bld gHb Est-mCnc: 169
Hemoglobin A1C: 7.5

## 2017-03-06 MED ORDER — BLOOD GLUCOSE MONITOR KIT: PACK | 0 refills | Status: AC

## 2017-03-06 NOTE — Progress Notes (Signed)
Patient: Casey Reese Female    DOB: [birth year]   26 y.o.   MRN: [id number] Visit Date: 03/06/2017  Today's Provider: Lelon Huh, MD   Chief Complaint  Patient presents with  . Diabetes    follow up  . Hypertension    follow up   Subjective:    HPI   Diabetes Mellitus Type II, Follow-up:   Lab Results  Component Value Date   HGBA1C 8.0 06/30/2016   HGBA1C 8.9 03/17/2016   HGBA1C 8.8 (H) 07/09/2015   Last seen for diabetes 8 months ago.  Management since then includes; advised to start taking both doses of metformin at the same time due to frequently missing 2nd dose. She reports good compliance with treatment. She is not having side effects.  Current symptoms include none and have been stable. Home blood sugar records: blood sugars are not being checked at home  Episodes of hypoglycemia? no   Current Insulin Regimen: none Most Recent Eye Exam: >1 year ago Weight trend: increasing steadily Prior visit with dietician: no Current diet: in general, an "unhealthy" diet Current exercise: none  ------------------------------------------------------------------------   Hypertension, follow-up:  BP Readings from Last 3 Encounters:  02/03/17 128/82  02/02/17 (!) 169/95  01/29/17 (!) 163/87    She was last seen for hypertension 8 months ago.  BP at that visit was 124/80. Management since that visit includes; no changes.She reports good compliance with treatment. She is not having side effects.  She is not exercising. She is adherent to low salt diet.   Outside blood pressures are checked occasionally at the pharmacy; patient unsure of the readings. She is experiencing none.  Patient denies chest pain, chest pressure/discomfort, claudication, dyspnea, exertional chest pressure/discomfort, fatigue, irregular heart beat, lower extremity edema, near-syncope, orthopnea, palpitations, paroxysmal nocturnal dyspnea, syncope and tachypnea.   Cardiovascular  risk factors include diabetes mellitus, hypertension and obesity (BMI >= 30 kg/m2).  Use of agents associated with hypertension: none.   ------------------------------------------------------------------------ Irregular Menstrual cycles: Patient comes in today reporting that she has heavy menstrual cycles. She has been seen by [facility] for this problem 1-2 years ago and her pap smear was normal. Patient states that provider provider is no longer at [facility] clinic and she is still having problems with heavy periods with clotting and cramping. She requests refill to gyn for evaluation.    Allergies  Allergen Reactions  . Sulfa Antibiotics      Current Outpatient Medications:  .  acetaminophen (TYLENOL) 500 MG tablet, One or two 3 x day for aches, Disp: 30 tablet, Rfl: 0 .  acetaminophen-codeine (TYLENOL #3) 300-30 MG tablet, Take 1 tablet by mouth every 8 (eight) hours as needed for moderate pain., Disp: 10 tablet, Rfl: 0 .  albuterol (PROVENTIL HFA) 108 (90 Base) MCG/ACT inhaler, Inhale 2 puffs into the lungs every 6 (six) hours as needed., Disp: 18 g, Rfl: 3 .  blood glucose meter kit and supplies KIT, Dispense based on patient and insurance preference to check sugar once daily. (FOR ICD-9 250.00), Disp: 1 each, Rfl: 0 .  chlorthalidone (HYGROTON) 25 MG tablet, Take 1 tablet (25 mg total) by mouth daily., Disp: 90 tablet, Rfl: 1 .  diltiazem (CARTIA XT) 120 MG 24 hr capsule, TAKE 1 CAPSULE BY MOUTH EVERY DAY, Disp: 30 capsule, Rfl: 3 .  fluticasone (FLONASE) 50 MCG/ACT nasal spray, Place 2 sprays into both nostrils daily., Disp: 16 g, Rfl: 0 .  ibuprofen (ADVIL,MOTRIN) 600  MG tablet, Take 1 tablet (600 mg total) by mouth every 8 (eight) hours as needed., Disp: 30 tablet, Rfl: 0 .  metFORMIN (GLUCOPHAGE-XR) 500 MG 24 hr tablet, Take two tablets daily, Disp: 180 tablet, Rfl: 1 .  famotidine (PEPCID) 40 MG tablet, Take 1 tablet (40 mg total) by mouth daily. (Patient not taking:  Reported on 03/06/2017), Disp: 30 tablet, Rfl: 5  Review of Systems  Constitutional: Negative for appetite change, chills, fatigue and fever.  Respiratory: Negative for chest tightness and shortness of breath.   Cardiovascular: Negative for chest pain and palpitations.  Gastrointestinal: Negative for abdominal pain, nausea and vomiting.  Neurological: Negative for dizziness and weakness.   Depression screen [facility] 2/9 03/06/2017 07/09/2015  Decreased Interest 1 0  Down, Depressed, Hopeless 0 0  PHQ - 2 Score 1 0    Social History   Tobacco Use  . Smoking status: Current Some Day Smoker    Types: Cigarettes  . Smokeless tobacco: Never Used  . Tobacco comment: 1 pack every 2 weeks. Started smoking at age 50  Substance Use Topics  . Alcohol use: Yes    Alcohol/week: 0.0 oz    Comment: occasionally   Objective:   BP 138/90 (BP Location: Left Arm, Cuff Size: Large)   Pulse (!) 106   Temp 98.7 F (37.1 C) (Oral)   Resp 16   Wt 279 lb (126.6 kg)   SpO2 99% Comment: room air  BMI 45.03 kg/m  Vitals:   03/06/17 1531 03/06/17 1543  BP: (!) 148/90 138/90  Pulse: (!) 106   Resp: 16   Temp: 98.7 F (37.1 C)   TempSrc: Oral   SpO2: 99%   Weight: 279 lb (126.6 kg)      Physical Exam   General Appearance:    Alert, cooperative, no distress, obese  Eyes:    PERRL, conjunctiva/corneas clear, EOM's intact       Lungs:     Clear to auscultation bilaterally, respirations unlabored  Heart:    Regular rate and rhythm  Neurologic:   Awake, alert, oriented x 3. No apparent focal neurological           defect.       Results for orders placed or performed in visit on 03/06/17  POCT HgB A1C  Result Value Ref Range   Hemoglobin A1C 7.5    Est. average glucose Bld gHb Est-mCnc 169         Assessment & Plan:     1. Type 2 diabetes mellitus without complication, without long-term current use of insulin (HCC) Well controlled.  Continue current medications.   - POCT HgB A1C - blood  glucose meter kit and supplies KIT; Dispense based on patient and insurance preference to check sugar once daily. (FOR ICD-9 250.00)  Dispense: 1 each; Refill: 0  2. DUB (dysfunctional uterine bleeding)  - Ambulatory referral to Gynecology  3. Essential hypertension Stable Continue current medications.    4. Morbid obesity ([place]) Encourage healthier diet and increasing physical activity.          Lelon Huh, MD  [facility]

## 2017-03-10 ENCOUNTER — Ambulatory Visit: Payer: Self-pay | Admitting: Family Medicine

## 2017-03-15 NOTE — ED Provider Notes (Signed)
[facility] Emergency Department Provider Note   ____________________________________________    I have reviewed the triage vital signs and the nursing notes.   HISTORY  Chief Complaint Cough; Neck Pain; and Chest Pain     HPI Casey Reese is a 26 y.o. female who presents with complaints of cough and chest congestion.  Symptoms started approximately 48 hours ago.  She denies fever or chills.  No recent travel.  No calf pain or swelling.  No pleurisy.  She does smoke.  She denies shortness of breath.   Past Medical History:  Diagnosis Date  . Diabetes mellitus without complication ([place])   . History of chicken pox   . Hypertension   . 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 GERD (gastroesophageal reflux disease) 05/23/2016  . Helicobacter pylori gastritis 07/09/2015  . Essential hypertension 06/29/2015  . Diabetes mellitus ([place]) 06/04/2015  . Morbid obesity ([place]) 01/18/2008  . 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 ABOVE KNEE LEG AMPUTATION Left 2003  . HIP FUSION Left     Prior to Admission medications   Medication Sig Start Date End Date Taking? Authorizing Provider  acetaminophen (TYLENOL) 500 MG tablet One or two 3 x day for aches 06/26/16   Carmon Ginsberg, PA  acetaminophen-codeine (TYLENOL #3) 300-30 MG tablet Take 1 tablet by mouth every 8 (eight) hours as needed for moderate pain. 01/29/17   Menshew, Dannielle Karvonen, PA-C  albuterol (PROVENTIL HFA) 108 (90 Base) MCG/ACT inhaler Inhale 2 puffs into the lungs every 6 (six) hours as needed. 02/03/17   Birdie Sons, MD  blood glucose meter kit and supplies KIT Dispense based on patient and insurance preference to check sugar once daily. (FOR ICD-9  250.00) 03/06/17   Birdie Sons, MD  chlorthalidone (HYGROTON) 25 MG tablet Take 1 tablet (25 mg total) by mouth daily. 03/17/16   Birdie Sons, MD  diltiazem (CARTIA XT) 120 MG 24 hr capsule TAKE 1 CAPSULE BY MOUTH EVERY DAY 03/05/16   Birdie Sons, MD  famotidine (PEPCID) 40 MG tablet Take 1 tablet (40 mg total) by mouth daily. Patient not taking: Reported on 03/06/2017 06/30/16   Birdie Sons, MD  fluticasone [facility]) 50 MCG/ACT nasal spray Place 2 sprays into both nostrils daily. 01/29/17   Menshew, Dannielle Karvonen, PA-C  ibuprofen (ADVIL,MOTRIN) 600 MG tablet Take 1 tablet (600 mg total) by mouth every 8 (eight) hours as needed. 06/28/16   Darel Hong, MD  metFORMIN (GLUCOPHAGE-XR) 500 MG 24 hr tablet Take two tablets daily 03/17/16   Birdie Sons, MD     Allergies Sulfa antibiotics  Family History  Problem Relation Age of Onset  . Diabetes Mother        type 2    Social History Social History   Tobacco Use  . Smoking status: Current Some Day Smoker    Types: Cigarettes  . Smokeless tobacco: Never Used  . Tobacco comment: 1 pack every 2 weeks. Started smoking at age 21  Substance Use Topics  . Alcohol use: Yes    Alcohol/week: 0.0 oz    Comment: occasionally  . Drug use: No    Review of Systems  Constitutional: No  fever/chills  ENT: No sore throat. Cardiovascular: As above Respiratory: As above Gastrointestinal: No abdominal pain.  No nausea, no vomiting.    Musculoskeletal: Negative for back pain.  Neurological: Negative for headaches   ____________________________________________   PHYSICAL EXAM:  VITAL SIGNS: ED Triage Vitals  Enc Vitals Group     BP 02/02/17 1855 (!) 169/89     Pulse Rate 02/02/17 1853 88     Resp 02/02/17 1853 18     Temp 02/02/17 1853 98.6 F (37 C)     Temp Source 02/02/17 1853 Oral     SpO2 02/02/17 1853 100 %     Weight 02/02/17 1854 122.5 kg (270 lb)     Height 02/02/17 1854 1.676 m (_0 )     Head  Circumference --      Peak Flow --      Pain Score 02/02/17 1853 10     Pain Loc --      Pain Edu? --      Excl. in [place]? --     Constitutional: Alert and oriented. No acute distress. Pleasant and interactive Eyes: Conjunctivae are normal.   Nose: No congestion/rhinnorhea. Mouth/Throat: Mucous membranes are moist.   Neck:  Painless ROM Cardiovascular: Normal rate, regular rhythm. Grossly normal heart sounds.  Good peripheral circulation. Respiratory: Normal respiratory effort.  No retractions.  Scattered mild   Musculoskeletal: No lower extremity tenderness nor edema.  Warm and well perfused Neurologic:  Normal speech and language. No gross focal neurologic deficits are appreciated.  Skin:  Skin is warm, dry and intact. No rash noted. Psychiatric: Mood and affect are normal. Speech and behavior are normal.  ____________________________________________   LABS (all labs ordered are listed, but only abnormal results are displayed)  Labs Reviewed  BASIC METABOLIC PANEL - Abnormal; Notable for the following components:      Result Value   Glucose, Bld 127 (*)    All other components within normal limits  CBC - Abnormal; Notable for the following components:   WBC 11.1 (*)    Hemoglobin 11.9 (*)    MCV 74.6 (*)    MCH 24.3 (*)    RDW 15.9 (*)    All other components within normal limits  TROPONIN I   ____________________________________________  EKG  ED ECG REPORT I, Lavonia Drafts, the attending physician, personally viewed and interpreted this ECG.  Date: 03/15/2017  Rate: 91 Rhythm: normal sinus rhythm QRS Axis: normal Intervals: normal ST/T Wave abnormalities: normal Narrative Interpretation: no evidence of acute ischemia  ____________________________________________  RADIOLOGY  X-ray normal ____________________________________________   PROCEDURES  Procedure(s) performed: No  Procedures   Critical Care  performed:No ____________________________________________   INITIAL IMPRESSION / ASSESSMENT AND PLAN / ED COURSE  Pertinent labs & imaging results that were available during my care of the patient were reviewed by me and considered in my medical decision making (see chart for details).  Patient well-appearing in no acute distress.  Symptoms are most consistent with upper respiratory tract infection given chest congestion and cough.  Lab work is unremarkable, EKG is normal.  Chest x-ray is normal.  Recommend supportive care and outpatient follow-up as needed.  Return precautions discussed    ____________________________________________   FINAL CLINICAL IMPRESSION(S) / ED DIAGNOSES  Final diagnoses:  Viral upper respiratory tract infection        Note:  This document was prepared using Dragon voice recognition software and may include unintentional dictation errors.    Lavonia Drafts, MD 03/15/17 [phone]

## 2017-03-30 ENCOUNTER — Telehealth: Payer: Self-pay | Admitting: Family Medicine

## 2017-03-30 NOTE — Telephone Encounter (Signed)
Attempts have been made to contact pt since Oct for GI referral (rectal bleeding) Pt has no voicemail but I left voicemail on [employer]'s phone who is on [place]. Pt has not returned calls from GI department or myself.It looks like the GI department was able to leave 1 voicemail on pt's phone

## 2017-04-10 ENCOUNTER — Telehealth: Payer: Self-pay | Admitting: Obstetrics & Gynecology

## 2017-04-10 NOTE — Telephone Encounter (Signed)
[facility] referring for DUB (dysfunctional uterine bleeding). Unable to reach patient due to voicemail not set up

## 2017-04-14 ENCOUNTER — Encounter: Payer: Self-pay | Admitting: Family Medicine

## 2017-04-14 NOTE — Telephone Encounter (Signed)
Unable to reach patient due to voice mail not set up.

## 2017-04-15 NOTE — Telephone Encounter (Signed)
Unable to reach patient due to voice mail not set up.

## 2017-05-09 ENCOUNTER — Other Ambulatory Visit: Payer: Self-pay

## 2017-05-09 ENCOUNTER — Encounter: Payer: Self-pay | Admitting: Emergency Medicine

## 2017-05-09 ENCOUNTER — Emergency Department
Admission: EM | Admit: 2017-05-09 | Discharge: 2017-05-09 | Disposition: A | Discharge status: Home / Self Care | Payer: Medicare Other | Attending: Emergency Medicine | Admitting: Emergency Medicine

## 2017-05-09 DIAGNOSIS — F17210 Nicotine dependence, cigarettes, uncomplicated: Secondary | ICD-10-CM | POA: Insufficient documentation

## 2017-05-09 DIAGNOSIS — K219 Gastro-esophageal reflux disease without esophagitis: Secondary | ICD-10-CM | POA: Insufficient documentation

## 2017-05-09 DIAGNOSIS — R079 Chest pain, unspecified: Secondary | ICD-10-CM | POA: Diagnosis not present

## 2017-05-09 DIAGNOSIS — M5412 Radiculopathy, cervical region: Secondary | ICD-10-CM | POA: Insufficient documentation

## 2017-05-09 DIAGNOSIS — Z79899 Other long term (current) drug therapy: Secondary | ICD-10-CM | POA: Insufficient documentation

## 2017-05-09 DIAGNOSIS — I10 Essential (primary) hypertension: Secondary | ICD-10-CM | POA: Diagnosis not present

## 2017-05-09 DIAGNOSIS — M542 Cervicalgia: Secondary | ICD-10-CM | POA: Diagnosis not present

## 2017-05-09 DIAGNOSIS — Z7984 Long term (current) use of oral hypoglycemic drugs: Secondary | ICD-10-CM | POA: Insufficient documentation

## 2017-05-09 DIAGNOSIS — E119 Type 2 diabetes mellitus without complications: Secondary | ICD-10-CM | POA: Insufficient documentation

## 2017-05-09 DIAGNOSIS — M549 Dorsalgia, unspecified: Secondary | ICD-10-CM | POA: Diagnosis not present

## 2017-05-09 DIAGNOSIS — Z89612 Acquired absence of left leg above knee: Secondary | ICD-10-CM | POA: Insufficient documentation

## 2017-05-09 MED ORDER — MELOXICAM 15 MG PO TABS: 15.0000 mg | ORAL_TABLET | Freq: Every day | ORAL | 0 refills | Status: DC

## 2017-05-09 MED ORDER — RANITIDINE HCL 300 MG PO TABS: 300.0000 mg | ORAL_TABLET | Freq: Every day | ORAL | 0 refills | Status: DC

## 2017-05-09 MED ORDER — TRAMADOL HCL 50 MG PO TABS
50.0000 mg | ORAL_TABLET | Freq: Once | ORAL | Status: AC
  Administered 2017-05-09: 50 mg via ORAL
  Filled 2017-05-09: qty 1

## 2017-05-09 MED ORDER — OMEPRAZOLE 10 MG PO CPDR: 10.0000 mg | DELAYED_RELEASE_CAPSULE | Freq: Every day | ORAL | 0 refills | Status: DC

## 2017-05-09 MED ORDER — METHOCARBAMOL 500 MG PO TABS: 500.0000 mg | ORAL_TABLET | Freq: Four times a day (QID) | ORAL | 0 refills | Status: DC

## 2017-05-09 NOTE — ED Notes (Signed)
Pt reports upper back pain, but she states that it is more intense that normal and that she feels it through her chest.  Pt is A&Ox4, in NAD.

## 2017-05-09 NOTE — ED Triage Notes (Addendum)
Pt c/o chronic back pain, pt states pain is around the base of her neck. Denies any new injury. Pt also states left shoulder has been hurting, pt uses crutches continuously due to AKA on left.  Taking tylenol at home for the pain.

## 2017-05-09 NOTE — ED Provider Notes (Signed)
[facility] Emergency Department Provider Note  ____________________________________________  Time seen: Approximately 4:51 PM  I have reviewed the triage vital signs and the nursing notes.   HISTORY  Chief Complaint Back Pain    HPI Casey Reese is a 27 y.o. female who presents the emergency department complaining of neck and shoulder pain.  Patient reports that she intermittently has neck and back pain but that it is been worse over the past several days.  Patient reports that it radiates from her neck into her left shoulder and sometimes her left arm.  Patient uses crutches for ambulation as she has an above-knee the amputation to the left leg.  Patient denies any trauma or falls.  She denies any numbness or tingling in bilateral upper extremities.  No headache, visual changes, abdominal pain, nausea vomiting.  Patient is also endorsing some intermittent chest pain.  Patient reports that she has a history of acid reflux, is supposed to be on medications for same but has not filled her medication in a long period of time.  Patient reports that symptoms are consistent with GERD but she "wants it evaluated."  Patient has no cardiac history.  Patient denies any palpitations, shortness of breath, radiation of chest pain.  Patient describes it as a intermittent burning sensation.  Past Medical History:  Diagnosis Date  . Diabetes mellitus without complication ([place])   . History of chicken pox   . Hypertension   . 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 GERD (gastroesophageal reflux disease) 05/23/2016  . Helicobacter pylori gastritis 07/09/2015  . Essential hypertension 06/29/2015  . Diabetes mellitus ([place]) 06/04/2015  . Morbid obesity ([place]) 01/18/2008  . 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 Acquired absence of lower extremity above knee  ([place]) 04/28/2001    Past Surgical History:  Procedure Laterality Date  . ABOVE KNEE LEG AMPUTATION Left 2003  . HIP FUSION Left     Prior to Admission medications   Medication Sig Start Date End Date Taking? Authorizing Provider  acetaminophen (TYLENOL) 500 MG tablet One or two 3 x day for aches 06/26/16   Carmon Ginsberg, PA  acetaminophen-codeine (TYLENOL #3) 300-30 MG tablet Take 1 tablet by mouth every 8 (eight) hours as needed for moderate pain. 01/29/17   Menshew, Dannielle Karvonen, PA-C  albuterol (PROVENTIL HFA) 108 (90 Base) MCG/ACT inhaler Inhale 2 puffs into the lungs every 6 (six) hours as needed. 02/03/17   Birdie Sons, MD  blood glucose meter kit and supplies KIT Dispense based on patient and insurance preference to check sugar once daily. (FOR ICD-9 250.00) 03/06/17   Birdie Sons, MD  chlorthalidone (HYGROTON) 25 MG tablet Take 1 tablet (25 mg total) by mouth daily. 03/17/16   Birdie Sons, MD  diltiazem (CARTIA XT) 120 MG 24 hr capsule TAKE 1 CAPSULE BY MOUTH EVERY DAY 03/05/16   Birdie Sons, MD  famotidine (PEPCID) 40 MG tablet Take 1 tablet (40 mg total) by mouth daily. Patient not taking: Reported on 03/06/2017 06/30/16   Birdie Sons, MD  fluticasone [facility]) 50 MCG/ACT nasal spray Place 2 sprays into both nostrils daily. 01/29/17   Menshew, Dannielle Karvonen, PA-C  ibuprofen (ADVIL,MOTRIN) 600 MG tablet Take 1 tablet (600 mg total) by mouth every 8 (eight) hours as needed. 06/28/16   Darel Hong, MD  meloxicam (MOBIC) 15 MG tablet Take 1 tablet (15 mg total) by mouth daily. 05/09/17   Cuthriell, Charline Bills, PA-C  metFORMIN (GLUCOPHAGE-XR) 500 MG 24 hr tablet Take two tablets daily 03/17/16   Birdie Sons, MD  methocarbamol (ROBAXIN) 500 MG tablet Take 1 tablet (500 mg total) by mouth 4 (four) times daily. 05/09/17   Cuthriell, Charline Bills, PA-C  omeprazole (PRILOSEC) 10 MG capsule Take 1 capsule (10 mg total) by mouth daily. 05/09/17   Cuthriell, Charline Bills,  PA-C  ranitidine (ZANTAC) 300 MG tablet Take 1 tablet (300 mg total) by mouth at bedtime. 05/09/17 05/09/18  Cuthriell, Charline Bills, PA-C    Allergies Sulfa antibiotics  Family History  Problem Relation Age of Onset  . Diabetes Mother        type 2    Social History Social History   Tobacco Use  . Smoking status: Current Some Day Smoker    Types: Cigarettes  . Smokeless tobacco: Never Used  . Tobacco comment: 1 pack every 2 weeks. Started smoking at age 89  Substance Use Topics  . Alcohol use: Yes    Alcohol/week: 0.0 oz    Comment: occasionally  . Drug use: No     Review of Systems  Constitutional: No fever/chills Eyes: No visual changes. Cardiovascular: Positive chest pain. Respiratory: no cough. No SOB. Gastrointestinal: No abdominal pain.  No nausea, no vomiting.   Musculoskeletal: Positive for neck pain radiating into the left shoulder Skin: Negative for rash, abrasions, lacerations, ecchymosis. Neurological: Negative for headaches, focal weakness or numbness. 10-point ROS otherwise negative.  ____________________________________________   PHYSICAL EXAM:  VITAL SIGNS: ED Triage Vitals  Enc Vitals Group     BP 05/09/17 1557 (!) 175/85     Pulse Rate 05/09/17 1557 99     Resp 05/09/17 1557 18     Temp 05/09/17 1557 98.5 F (36.9 C)     Temp Source 05/09/17 1557 Oral     SpO2 05/09/17 1557 100 %     Weight 05/09/17 1557 262 lb (118.8 kg)     Height 05/09/17 1557 _0  (1.676 m)     Head Circumference --      Peak Flow --      Pain Score 05/09/17 1556 10     Pain Loc --      Pain Edu? --      Excl. in [place]? --      Constitutional: Alert and oriented. Well appearing and in no acute distress. Eyes: Conjunctivae are normal. PERRL. EOMI. Head: Atraumatic. ENT:      Ears:       Nose: No congestion/rhinnorhea.      Mouth/Throat: Mucous membranes are moist.  Neck: No stridor.  No midline cervical spine tenderness to palpation.  She is tenderness to  palpation throughout the left-sided paraspinal and trapezius muscle groups.  Spasms are appreciated.  No tenderness to palpation over the scapula or scapular spine.  No tenderness to palpation of the clavicle.  No other tenderness to palpation to the shoulder.  Radial pulse intact bilateral upper extremities.  Sensation intact and equal bilateral upper extremities.  Cardiovascular: Normal rate, regular rhythm. Normal S1 and S2.  Good peripheral circulation. Respiratory: Normal respiratory effort without tachypnea or retractions. Lungs CTAB. Good air entry to the bases with no decreased or absent breath sounds. Gastrointestinal: Bowel sounds 4 quadrants. Soft and nontender to palpation. No guarding or rigidity. No palpable masses. No distention. No CVA tenderness. Musculoskeletal: Full range  of motion to all extremities. No gross deformities appreciated. Neurologic:  Normal speech and language. No gross focal neurologic deficits are appreciated.  Skin:  Skin is warm, dry and intact. No rash noted. Psychiatric: Mood and affect are normal. Speech and behavior are normal. Patient exhibits appropriate insight and judgement.   ____________________________________________   LABS (all labs ordered are listed, but only abnormal results are displayed)  Labs Reviewed - No data to display ____________________________________________  EKG  ED ECG REPORT I, Charline Bills Cuthriell,  personally viewed and interpreted this ECG.   Date: 05/09/2017  EKG Time: 1727 hrs.  Rate: 87 bpm  Rhythm: normal EKG, normal sinus rhythm  Axis: Normal axis  Intervals:none  ST&T Change: No ST elevations or depressions noted  Normal EKG  ____________________________________________  RADIOLOGY   No results found.  ____________________________________________    PROCEDURES  Procedure(s) performed:    Procedures    Medications  traMADol (ULTRAM) tablet 50 mg (50 mg Oral Given 05/09/17 1724)      ____________________________________________   INITIAL IMPRESSION / ASSESSMENT AND PLAN / ED COURSE  Pertinent labs & imaging results that were available during my care of the patient were reviewed by me and considered in my medical decision making (see chart for details).  Review of the [place] CSRS was performed in accordance of the [place] prior to dispensing any controlled drugs.  Clinical Course as of May 09 1798  Sat May 09, 2017  1705 Patient presents with 2 complaints.  Her initial complaint is left-sided neck and shoulder pain.  Patient also endorses some intermittent burning chest pain.  Patient does have a history of GERD does not take her prescribed medications for same.  At this time, EKG ordered but no further workup is deemed necessary at this time.  No indication for imaging at this time.  Differential included cervical strain, fall with fracture, cervical radiculopathy.  Chest pain differential included GERD versus cardiac event versus bronchitis or pneumonia.  [JC]    Clinical Course User Index [JC] Cuthriell, Charline Bills, PA-C    Patient's diagnosis is consistent with cervical radiculopathy and GERD.  Patient presented with complaints of neck and shoulder pain as well as chest pain.  Patient had a history of GERD and symptoms were similar, however EKG was ordered.  This reveals normal sinus rhythm.  No further workup deemed necessary at this time.. Patient will be discharged home with prescriptions for omeprazole, Zantac, meloxicam, Robaxin for symptom control. Patient is to follow up with primary care as needed or otherwise directed. Patient is given ED precautions to return to the ED for any worsening or new symptoms.     ____________________________________________  FINAL CLINICAL IMPRESSION(S) / ED DIAGNOSES  Final diagnoses:  Cervical radiculopathy  Gastroesophageal reflux disease, esophagitis presence not specified      NEW MEDICATIONS STARTED DURING THIS  VISIT:  ED Discharge Orders        Ordered    ranitidine (ZANTAC) 300 MG tablet  Daily at bedtime     05/09/17 1755    omeprazole (PRILOSEC) 10 MG capsule  Daily     05/09/17 1755    meloxicam (MOBIC) 15 MG tablet  Daily     05/09/17 1755    methocarbamol (ROBAXIN) 500 MG tablet  4 times daily     05/09/17 1755          This chart was dictated using voice recognition software/Dragon. Despite best efforts to proofread, errors can occur which can change the  meaning. Any change was purely unintentional.    Darletta Moll, PA-C 05/09/17 1800    Carrie Mew, MD 05/09/17 [phone]

## 2017-05-09 NOTE — ED Notes (Signed)

## 2017-05-14 ENCOUNTER — Ambulatory Visit: Payer: Medicare Other | Admitting: Family Medicine

## 2017-05-14 NOTE — Progress Notes (Deleted)
Patient: Casey Reese Female    DOB: 12/23/1990   27 y.o.   MRN: [id number] Visit Date: 05/14/2017  Today's Provider: Lelon Huh, MD   No chief complaint on file.  Subjective:    HPI   Follow up ER visit  Patient was seen in ER for Back pain on 05/09/2017. She was treated for Cervical radiculopathy and GERD . Treatment for this included; EKG, normal. Patient was discharged home with rx for Zantac, Meloxicam, and Robaxin for symptom control. Advised to follow-up with pcp as needed. She reports {DESC; EXCELLENT/GOOD/FAIR:19992} compliance with treatment. She reports this condition is {improved/worse/unchanged:3041574}.  ------------------------------------------------------------------------------------    Allergies  Allergen Reactions  . Sulfa Antibiotics     UNKNOWN     Current Outpatient Medications:  .  acetaminophen (TYLENOL) 500 MG tablet, One or two 3 x day for aches, Disp: 30 tablet, Rfl: 0 .  acetaminophen-codeine (TYLENOL #3) 300-30 MG tablet, Take 1 tablet by mouth every 8 (eight) hours as needed for moderate pain., Disp: 10 tablet, Rfl: 0 .  albuterol (PROVENTIL HFA) 108 (90 Base) MCG/ACT inhaler, Inhale 2 puffs into the lungs every 6 (six) hours as needed., Disp: 18 g, Rfl: 3 .  blood glucose meter kit and supplies KIT, Dispense based on patient and insurance preference to check sugar once daily. (FOR ICD-9 250.00), Disp: 1 each, Rfl: 0 .  chlorthalidone (HYGROTON) 25 MG tablet, Take 1 tablet (25 mg total) by mouth daily., Disp: 90 tablet, Rfl: 1 .  diltiazem (CARTIA XT) 120 MG 24 hr capsule, TAKE 1 CAPSULE BY MOUTH EVERY DAY, Disp: 30 capsule, Rfl: 3 .  famotidine (PEPCID) 40 MG tablet, Take 1 tablet (40 mg total) by mouth daily. (Patient not taking: Reported on 03/06/2017), Disp: 30 tablet, Rfl: 5 .  fluticasone (FLONASE) 50 MCG/ACT nasal spray, Place 2 sprays into both nostrils daily., Disp: 16 g, Rfl: 0 .  ibuprofen (ADVIL,MOTRIN) 600 MG tablet,  Take 1 tablet (600 mg total) by mouth every 8 (eight) hours as needed., Disp: 30 tablet, Rfl: 0 .  meloxicam (MOBIC) 15 MG tablet, Take 1 tablet (15 mg total) by mouth daily., Disp: 30 tablet, Rfl: 0 .  metFORMIN (GLUCOPHAGE-XR) 500 MG 24 hr tablet, Take two tablets daily, Disp: 180 tablet, Rfl: 1 .  methocarbamol (ROBAXIN) 500 MG tablet, Take 1 tablet (500 mg total) by mouth 4 (four) times daily., Disp: 16 tablet, Rfl: 0 .  omeprazole (PRILOSEC) 10 MG capsule, Take 1 capsule (10 mg total) by mouth daily., Disp: 30 capsule, Rfl: 0 .  ranitidine (ZANTAC) 300 MG tablet, Take 1 tablet (300 mg total) by mouth at bedtime., Disp: 30 tablet, Rfl: 0  Review of Systems  Constitutional: Negative for appetite change, chills, fatigue and fever.  Respiratory: Negative for chest tightness and shortness of breath.   Cardiovascular: Negative for chest pain and palpitations.  Gastrointestinal: Negative for abdominal pain, nausea and vomiting.  Neurological: Negative for dizziness and weakness.    Social History   Tobacco Use  . Smoking status: Current Some Day Smoker    Types: Cigarettes  . Smokeless tobacco: Never Used  . Tobacco comment: 1 pack every 2 weeks. Started smoking at age 39  Substance Use Topics  . Alcohol use: Yes    Alcohol/week: 0.0 oz    Comment: occasionally   Objective:   There were no vitals taken for this visit. There were no vitals filed for this visit.   Physical Exam  Assessment & Plan:           Lelon Huh, MD  [facility]

## 2017-05-29 ENCOUNTER — Ambulatory Visit (INDEPENDENT_AMBULATORY_CARE_PROVIDER_SITE_OTHER): Payer: Medicare Other | Admitting: Obstetrics and Gynecology

## 2017-05-29 ENCOUNTER — Encounter: Payer: Self-pay | Admitting: Obstetrics and Gynecology

## 2017-05-29 VITALS — BP 144/78 | HR 89 | Ht 66.0 in | Wt 272.0 lb

## 2017-05-29 DIAGNOSIS — N911 Secondary amenorrhea: Secondary | ICD-10-CM | POA: Diagnosis not present

## 2017-05-29 DIAGNOSIS — N939 Abnormal uterine and vaginal bleeding, unspecified: Secondary | ICD-10-CM

## 2017-05-29 NOTE — Progress Notes (Signed)
 Patient ID: Teara B Cervantes, female   DOB: 12/16/1990, 27 y.o.   MRN: [id number]  Reason for Consult: Metrorrhagia and Menorrhagia (cramping)   Referred by Fisher, Donald E, MD  Subjective:     HPI:  Shirlette B Woodburn is a 27 y.o. female G0P0 Patient presents today for evaluation regarding irregular periods.  The patient states that since menarche at the age of 10 she has had irregular periods.  She says that she will average 5-6 periods a year.  Patient reports that she is currently on her period.  She says that it started on 05/25/2017.  Prior to that her last period was more than 5 months ago in either August or July.  Patient states that this.  In her previous period was very heavy.  She says that she is passing clots that are golfball size or smaller.  She changes her pad 3-4 times in an hour she feels like she is gushing watery blood.  She has had some dizziness and lightheadedness she has had accidents where she has blood through her clothing.  Patient states that she has had worse cramping with her most recent periods.  The patient denies any history of fibroids denies ovarian cyst denies sexually transmitted diseases.  The patient is not sexually active she is virginal.  Patient reports that her last Pap smear was 2 years ago.  Past Medical History:  Diagnosis Date  . Diabetes mellitus without complication (HCC)   . History of chicken pox   . Hypertension   . S/P above knee amputation (HCC)   . 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 Diabetes Mother        type 2   Past Surgical History:  Procedure Laterality Date  . ABOVE KNEE LEG AMPUTATION Left 2003  . HIP FUSION Left     Short Social History:  Social History   Tobacco Use  . Smoking status: Current Some Day Smoker    Types: Cigarettes  . Smokeless tobacco: Never Used  . Tobacco comment: 1 pack every 2 weeks. Started smoking at age 16  Substance Use  Topics  . Alcohol use: Yes    Alcohol/week: 0.0 oz    Comment: occasionally    Allergies  Allergen Reactions  . Sulfa Antibiotics     UNKNOWN    Current Outpatient Medications  Medication Sig Dispense Refill  . acetaminophen (TYLENOL) 500 MG tablet One or two 3 x day for aches 30 tablet 0  . albuterol (PROVENTIL HFA) 108 (90 Base) MCG/ACT inhaler Inhale 2 puffs into the lungs every 6 (six) hours as needed. 18 g 3  . blood glucose meter kit and supplies KIT Dispense based on patient and insurance preference to check sugar once daily. (FOR ICD-9 250.00) 1 each 0  . chlorthalidone (HYGROTON) 25 MG tablet Take 1 tablet (25 mg total) by mouth daily. 90 tablet 1  . diltiazem (CARTIA XT) 120 MG 24 hr capsule TAKE 1 CAPSULE BY MOUTH EVERY DAY 30 capsule 3  . metFORMIN (GLUCOPHAGE-XR) 500 MG 24 hr tablet Take two tablets daily 180 tablet 1  . omeprazole (PRILOSEC) 10 MG capsule Take 1 capsule (10 mg total) by mouth daily. 30 capsule 0  . ranitidine (ZANTAC) 300 MG tablet Take 1 tablet (300 mg total) by mouth at bedtime. 30 tablet 0   No current facility-administered medications for this visit.     Review of Systems    Constitutional: Negative for chills, fatigue, fever and unexpected weight change.  HENT: Negative for trouble swallowing.  Eyes: Negative for loss of vision.  Respiratory: Negative for cough, shortness of breath and wheezing.  Cardiovascular: Negative for chest pain, leg swelling, palpitations and syncope.  GI: Negative for abdominal pain, blood in stool, diarrhea, nausea and vomiting.  GU: Negative for difficulty urinating, dysuria, frequency and hematuria.  Musculoskeletal: Negative for back pain, leg pain and joint pain.  Skin: Negative for rash.  Neurological: Negative for dizziness, headaches, light-headedness, numbness and seizures.  Psychiatric: Negative for behavioral problem, confusion, depressed mood and sleep disturbance.        Objective:  Objective    Vitals:   05/29/17 1448  BP: (!) 144/78  Pulse: 89  Weight: 272 lb (123.4 kg)  Height: 5' 6" (1.676 m)   Body mass index is 43.9 kg/m.  Physical Exam  Constitutional: She is oriented to person, place, and time. She appears well-developed and well-nourished.  HENT:  Head: Normocephalic and atraumatic.  Eyes: EOM are normal.  Cardiovascular: Normal rate, regular rhythm and normal heart sounds.  Pulmonary/Chest: Effort normal and breath sounds normal.  Abdominal: Soft. She exhibits no distension. There is no tenderness. There is no guarding.  Neurological: She is alert and oriented to person, place, and time.  Skin: Skin is warm and dry.  Psychiatric: She has a normal mood and affect. Her behavior is normal. Judgment and thought content normal.  Nursing note and vitals reviewed.    Assessment/Plan:     27yo G0P0 1. Abnormal uterine bleeding, likely related to anovulatory bleeding. Recommend evaluation for secondary amenorrhea. Will start evaluation by ordering cbc, tsh and prolactin. Patient is apprehensive about having a vaginal exam today while she is on her period. She would like to return in 1 week for an exam and evaluation at that time. Given the patients long history of anovulation and obesity I recommend an endometrial biopsy. Will order uterien ultrasound to evaluate for any structural causes of heavy uterine bleeding.   Patient will return for pap and EMB, transvaginal US, and lab work in 1 week Over 20 minutes spent face to face counseling the patient  Homero Fellers MD [facility] 05/31/17 11:28 PM

## 2017-06-03 ENCOUNTER — Telehealth: Payer: Self-pay | Admitting: Obstetrics and Gynecology

## 2017-06-03 ENCOUNTER — Encounter: Payer: Self-pay | Admitting: Family Medicine

## 2017-06-03 ENCOUNTER — Ambulatory Visit (INDEPENDENT_AMBULATORY_CARE_PROVIDER_SITE_OTHER): Payer: Medicare Other | Admitting: Family Medicine

## 2017-06-03 VITALS — BP 130/84 | HR 75 | Temp 98.2°F | Resp 16 | Wt 272.0 lb

## 2017-06-03 DIAGNOSIS — I10 Essential (primary) hypertension: Secondary | ICD-10-CM | POA: Diagnosis not present

## 2017-06-03 DIAGNOSIS — D649 Anemia, unspecified: Secondary | ICD-10-CM

## 2017-06-03 DIAGNOSIS — R197 Diarrhea, unspecified: Secondary | ICD-10-CM | POA: Diagnosis not present

## 2017-06-03 DIAGNOSIS — E119 Type 2 diabetes mellitus without complications: Secondary | ICD-10-CM | POA: Diagnosis not present

## 2017-06-03 DIAGNOSIS — R109 Unspecified abdominal pain: Secondary | ICD-10-CM | POA: Diagnosis not present

## 2017-06-03 DIAGNOSIS — R110 Nausea: Secondary | ICD-10-CM | POA: Diagnosis not present

## 2017-06-03 LAB — POCT GLYCOSYLATED HEMOGLOBIN (HGB A1C)
Est. average glucose Bld gHb Est-mCnc: 151
Hemoglobin A1C: 6.9

## 2017-06-03 MED ORDER — DILTIAZEM HCL ER COATED BEADS 120 MG PO CP24: ORAL_CAPSULE | ORAL | 3 refills | Status: AC

## 2017-06-03 NOTE — Telephone Encounter (Signed)
Pt was seen in the office with Dr. Jerene PitchSchuman and was to return for u/s and follow up. Pt was never schedule. Called and left voicemail for patient to call back to be schedule

## 2017-06-03 NOTE — Progress Notes (Signed)
Patient: Casey Reese Female    DOB: Nov 10, 1990   27 y.o.   MRN: [id number] Visit Date: 06/03/2017  Today's Provider: Lelon Huh, MD   Chief Complaint  Patient presents with  . Fatigue   Subjective:    Patient states she has been having fatigue and stomach pain for 3 weeks. Patient also states for the past 3 weeks she has been having dry mouth and lightheadedness. Patient states she had a bout of diarrhea 3 days ago and noticed her stool was a dark grayish looking color.   Has felt more fatigued the last couple of weeks.  Was in ER last month and prescribed ranitidine and omeprazole which have helped slightly. Hasn't been taking metformin consistently. Does have history of h.pylori gastritis treated in 2017 and she feels that current sx are similar.   She is also due for follow up diabetes and hypertension, is taking metformin and diltiazem consistently but does not have meter to check her blood sugars.     Allergies  Allergen Reactions  . Sulfa Antibiotics     UNKNOWN     Current Outpatient Medications:  .  acetaminophen (TYLENOL) 500 MG tablet, One or two 3 x day for aches, Disp: 30 tablet, Rfl: 0 .  albuterol (PROVENTIL HFA) 108 (90 Base) MCG/ACT inhaler, Inhale 2 puffs into the lungs every 6 (six) hours as needed., Disp: 18 g, Rfl: 3 .  blood glucose meter kit and supplies KIT, Dispense based on patient and insurance preference to check sugar once daily. (FOR ICD-9 250.00), Disp: 1 each, Rfl: 0 .  chlorthalidone (HYGROTON) 25 MG tablet, Take 1 tablet (25 mg total) by mouth daily., Disp: 90 tablet, Rfl: 1 .  diltiazem (CARTIA XT) 120 MG 24 hr capsule, TAKE 1 CAPSULE BY MOUTH EVERY DAY, Disp: 30 capsule, Rfl: 3 .  metFORMIN (GLUCOPHAGE-XR) 500 MG 24 hr tablet, Take two tablets daily, Disp: 180 tablet, Rfl: 1 .  omeprazole (PRILOSEC) 10 MG capsule, Take 1 capsule (10 mg total) by mouth daily., Disp: 30 capsule, Rfl: 0 .  ranitidine (ZANTAC) 300 MG tablet, Take 1  tablet (300 mg total) by mouth at bedtime., Disp: 30 tablet, Rfl: 0  Review of Systems  Constitutional: Positive for fatigue. Negative for appetite change, chills and fever.  Respiratory: Negative for chest tightness and shortness of breath.   Cardiovascular: Negative for chest pain and palpitations.  Gastrointestinal: Positive for abdominal pain and diarrhea. Negative for nausea and vomiting.  Neurological: Positive for light-headedness. Negative for dizziness and weakness.    Social History   Tobacco Use  . Smoking status: Current Some Day Smoker    Types: Cigarettes  . Smokeless tobacco: Never Used  . Tobacco comment: 1 pack every 2 weeks. Started smoking at age 42  Substance Use Topics  . Alcohol use: Yes    Alcohol/week: 0.0 oz    Comment: occasionally   Objective:   BP 130/84 (BP Location: Right Arm, Patient Position: Sitting, Cuff Size: Large)   Pulse 75   Temp 98.2 F (36.8 C) (Oral)   Resp 16   Wt 272 lb (123.4 kg)   LMP 05/25/2017 (Exact Date)   SpO2 99%   BMI 43.90 kg/m  Vitals:   06/03/17 1558  BP: 130/84  Pulse: 75  Resp: 16  Temp: 98.2 F (36.8 C)  TempSrc: Oral  SpO2: 99%  Weight: 272 lb (123.4 kg)     Physical Exam  General Appearance:  Alert, cooperative, no distress, obese  Eyes:    PERRL, conjunctiva/corneas clear, EOM's intact       Lungs:     Clear to auscultation bilaterally, respirations unlabored  Heart:    Regular rate and rhythm  Neurologic:   Awake, alert, oriented x 3. No apparent focal neurological           defect.       Lab Results  Component Value Date   HGBA1C 6.9 06/03/2017        Assessment & Plan:     1. Type 2 diabetes mellitus without complication, without long-term current use of insulin (HCC) Well controlled.  Continue current medications.   - POCT glycosylated hemoglobin (Hb A1C)  2. Abdominal pain, unspecified abdominal location Consider u//s if labs normal.  - Comprehensive metabolic panel - CBC - H.  pylori breath test - Amylase  3. Nausea  - Comprehensive metabolic panel - CBC - H. pylori breath test - Amylase  4. Diarrhea, unspecified type   5. Essential hypertension Continue current medications.   - diltiazem (CARTIA XT) 120 MG 24 hr capsule; TAKE 1 CAPSULE BY MOUTH EVERY DAY  Dispense: 30 capsule; Refill: 3       Lelon Huh, MD  [facility]

## 2017-06-04 DIAGNOSIS — R109 Unspecified abdominal pain: Secondary | ICD-10-CM | POA: Diagnosis not present

## 2017-06-04 DIAGNOSIS — D649 Anemia, unspecified: Secondary | ICD-10-CM | POA: Diagnosis not present

## 2017-06-04 DIAGNOSIS — D538 Other specified nutritional anemias: Secondary | ICD-10-CM | POA: Diagnosis not present

## 2017-06-04 DIAGNOSIS — R110 Nausea: Secondary | ICD-10-CM | POA: Diagnosis not present

## 2017-06-05 LAB — COMPREHENSIVE METABOLIC PANEL
ALK PHOS: 88 IU/L (ref 39–117)
ALT: 17 IU/L (ref 0–32)
AST: 11 IU/L (ref 0–40)
Albumin/Globulin Ratio: 1.5 (ref 1.2–2.2)
Albumin: 4.3 g/dL (ref 3.5–5.5)
BILIRUBIN TOTAL: 0.3 mg/dL (ref 0.0–1.2)
BUN / CREAT RATIO: 14 (ref 9–23)
BUN: 9 mg/dL (ref 6–20)
CHLORIDE: 101 mmol/L (ref 96–106)
CO2: 23 mmol/L (ref 20–29)
CREATININE: 0.66 mg/dL (ref 0.57–1.00)
Calcium: 9 mg/dL (ref 8.7–10.2)
GFR calc Af Amer: 139 mL/min/{1.73_m2} (ref >59)
GFR calc non Af Amer: 121 mL/min/{1.73_m2} (ref >59)
GLUCOSE: 130 mg/dL — AB (ref 65–99)
Globulin, Total: 2.9 g/dL (ref 1.5–4.5)
Potassium: 4 mmol/L (ref 3.5–5.2)
Sodium: 139 mmol/L (ref 134–144)
Total Protein: 7.2 g/dL (ref 6.0–8.5)

## 2017-06-05 LAB — CBC
Hematocrit: 32.3 % — ABNORMAL LOW (ref 34.0–46.6)
Hemoglobin: 9.8 g/dL — ABNORMAL LOW (ref 11.1–15.9)
MCH: 21.4 pg — ABNORMAL LOW (ref 26.6–33.0)
MCHC: 30.3 g/dL — ABNORMAL LOW (ref 31.5–35.7)
MCV: 70 fL — AB (ref 79–97)
PLATELETS: 478 10*3/uL — AB (ref 150–379)
RBC: 4.59 x10E6/uL (ref 3.77–5.28)
RDW: 16.9 % — ABNORMAL HIGH (ref 12.3–15.4)
WBC: 8 10*3/uL (ref 3.4–10.8)

## 2017-06-05 LAB — AMYLASE: AMYLASE: 47 U/L (ref 31–124)

## 2017-06-06 LAB — H. PYLORI BREATH TEST: H pylori Breath Test: NEGATIVE

## 2017-06-09 DIAGNOSIS — E1159 Type 2 diabetes mellitus with other circulatory complications: Secondary | ICD-10-CM | POA: Diagnosis not present

## 2017-06-09 DIAGNOSIS — I10 Essential (primary) hypertension: Secondary | ICD-10-CM | POA: Diagnosis not present

## 2017-06-09 DIAGNOSIS — N920 Excessive and frequent menstruation with regular cycle: Secondary | ICD-10-CM | POA: Diagnosis not present

## 2017-06-09 DIAGNOSIS — K068 Other specified disorders of gingiva and edentulous alveolar ridge: Secondary | ICD-10-CM | POA: Diagnosis not present

## 2017-06-09 DIAGNOSIS — Z8709 Personal history of other diseases of the respiratory system: Secondary | ICD-10-CM | POA: Diagnosis not present

## 2017-06-09 DIAGNOSIS — Z6841 Body Mass Index (BMI) 40.0 and over, adult: Secondary | ICD-10-CM | POA: Diagnosis not present

## 2017-06-09 DIAGNOSIS — E119 Type 2 diabetes mellitus without complications: Secondary | ICD-10-CM | POA: Diagnosis not present

## 2017-06-09 DIAGNOSIS — F419 Anxiety disorder, unspecified: Secondary | ICD-10-CM | POA: Insufficient documentation

## 2017-06-09 DIAGNOSIS — Z720 Tobacco use: Secondary | ICD-10-CM | POA: Diagnosis not present

## 2017-06-09 DIAGNOSIS — R002 Palpitations: Secondary | ICD-10-CM | POA: Diagnosis not present

## 2017-06-09 DIAGNOSIS — M542 Cervicalgia: Secondary | ICD-10-CM | POA: Diagnosis not present

## 2017-06-09 DIAGNOSIS — K2970 Gastritis, unspecified, without bleeding: Secondary | ICD-10-CM | POA: Diagnosis not present

## 2017-06-09 DIAGNOSIS — R109 Unspecified abdominal pain: Secondary | ICD-10-CM | POA: Diagnosis not present

## 2017-06-09 DIAGNOSIS — K219 Gastro-esophageal reflux disease without esophagitis: Secondary | ICD-10-CM | POA: Diagnosis not present

## 2017-06-09 DIAGNOSIS — M545 Low back pain: Secondary | ICD-10-CM | POA: Diagnosis not present

## 2017-06-09 DIAGNOSIS — R1013 Epigastric pain: Secondary | ICD-10-CM | POA: Diagnosis not present

## 2017-06-09 DIAGNOSIS — M25512 Pain in left shoulder: Secondary | ICD-10-CM | POA: Diagnosis not present

## 2017-06-09 DIAGNOSIS — Z89612 Acquired absence of left leg above knee: Secondary | ICD-10-CM | POA: Diagnosis not present

## 2017-06-09 DIAGNOSIS — R1012 Left upper quadrant pain: Secondary | ICD-10-CM | POA: Diagnosis not present

## 2017-06-09 DIAGNOSIS — Z23 Encounter for immunization: Secondary | ICD-10-CM | POA: Diagnosis not present

## 2017-06-17 LAB — B12 AND FOLATE PANEL
Folate: 7.3 ng/mL (ref >3.0)
Vitamin B-12: 771 pg/mL (ref 232–1245)

## 2017-06-17 LAB — HCG, SERUM, QUALITATIVE: HCG, BETA SUBUNIT, QUAL, SERUM: NEGATIVE m[IU]/mL (ref <6)

## 2017-06-17 LAB — SPECIMEN STATUS REPORT

## 2017-06-17 LAB — FERRITIN: FERRITIN: 8 ng/mL — AB (ref 15–150)

## 2017-06-18 ENCOUNTER — Telehealth: Payer: Self-pay

## 2017-06-18 ENCOUNTER — Telehealth: Payer: Self-pay | Admitting: Family Medicine

## 2017-06-18 NOTE — Telephone Encounter (Signed)
LMTCB and scheduled AWV. -MM 

## 2017-06-18 NOTE — Telephone Encounter (Signed)
Please advise 

## 2017-06-18 NOTE — Telephone Encounter (Signed)
Pt contacted office for refill request on the following medications:  1. ranitidine (ZANTAC) 150 MG tablet 2. iron supplement  [facility] [address]  Pt stated that Dr. Sherrie MustacheFisher has prescribed the ranitidine (ZANTAC) 150 MG tablet in the past and the provider at the hospital started pt back on the medication and she is requesting Dr. Sherrie MustacheFisher refill the medication. Pt also stated that at [facility] iron supplement was discussed and she thought Dr. Sherrie MustacheFisher was going to send in the Rx. Please advise. Thanks TNP

## 2017-06-19 MED ORDER — FERROUS GLUCONATE 324 (38 FE) MG PO TABS: 324.0000 mg | ORAL_TABLET | Freq: Every day | ORAL | 5 refills | Status: DC

## 2017-06-19 MED ORDER — RANITIDINE HCL 150 MG PO TABS: 150.0000 mg | ORAL_TABLET | Freq: Two times a day (BID) | ORAL | 11 refills | Status: DC

## 2017-06-19 MED ORDER — FERROUS GLUCONATE 324 (38 FE) MG PO TABS: 324.0000 mg | ORAL_TABLET | Freq: Two times a day (BID) | ORAL | 5 refills | Status: DC

## 2017-06-19 NOTE — Addendum Note (Signed)
Addended by: Malva LimesFISHER, DONALD E on: 06/19/2017 02:07 PM   Modules accepted: Orders

## 2017-06-29 DIAGNOSIS — D509 Iron deficiency anemia, unspecified: Secondary | ICD-10-CM | POA: Insufficient documentation

## 2017-06-30 NOTE — Telephone Encounter (Signed)
Pt has an apt on 07/03/17. Will await to see if apt gets scheduled following visit. Left note for pt to be scheduled for AWV and CPE next apt.  -MM

## 2017-07-01 DIAGNOSIS — I10 Essential (primary) hypertension: Secondary | ICD-10-CM | POA: Diagnosis not present

## 2017-07-01 DIAGNOSIS — E1159 Type 2 diabetes mellitus with other circulatory complications: Secondary | ICD-10-CM | POA: Diagnosis not present

## 2017-07-01 DIAGNOSIS — N921 Excessive and frequent menstruation with irregular cycle: Secondary | ICD-10-CM | POA: Diagnosis not present

## 2017-07-01 DIAGNOSIS — Z8619 Personal history of other infectious and parasitic diseases: Secondary | ICD-10-CM | POA: Diagnosis not present

## 2017-07-01 DIAGNOSIS — E119 Type 2 diabetes mellitus without complications: Secondary | ICD-10-CM | POA: Diagnosis not present

## 2017-07-01 DIAGNOSIS — D509 Iron deficiency anemia, unspecified: Secondary | ICD-10-CM | POA: Diagnosis not present

## 2017-07-03 ENCOUNTER — Other Ambulatory Visit: Payer: Self-pay | Admitting: *Deleted

## 2017-07-03 ENCOUNTER — Ambulatory Visit: Payer: Medicare Other | Admitting: Family Medicine

## 2017-07-03 DIAGNOSIS — D649 Anemia, unspecified: Secondary | ICD-10-CM

## 2017-07-03 LAB — IFOBT (OCCULT BLOOD): IFOBT: NEGATIVE

## 2017-07-27 ENCOUNTER — Telehealth: Payer: Self-pay

## 2017-07-27 NOTE — Telephone Encounter (Signed)
Called pt to schedule an AWV and pt states she will schedule this at a later time. Pt needs to set up her transportation and will CB following that.  -MM

## 2017-07-29 DIAGNOSIS — K760 Fatty (change of) liver, not elsewhere classified: Secondary | ICD-10-CM | POA: Diagnosis not present

## 2017-07-29 DIAGNOSIS — R109 Unspecified abdominal pain: Secondary | ICD-10-CM | POA: Diagnosis not present

## 2017-09-07 IMAGING — CT CT CHEST W/O CM
1 of 2 series · 14 of 32 positions shown, 18 images · non-contrast
Comparison: Chest radiograph dated 06/28/2015

CLINICAL DATA: 24-year-old female with chest tightness

EXAM:
CT CHEST WITHOUT CONTRAST
TECHNIQUE: Multidetector CT imaging of the chest was performed following the
standard protocol without IV contrast.

[Series 2: routine chest wo · axial · 0.84mm/px · z∈[-315,-65]mm · 14 of 60 slices shown, 18 images]
[im 5/60  mediastinal]
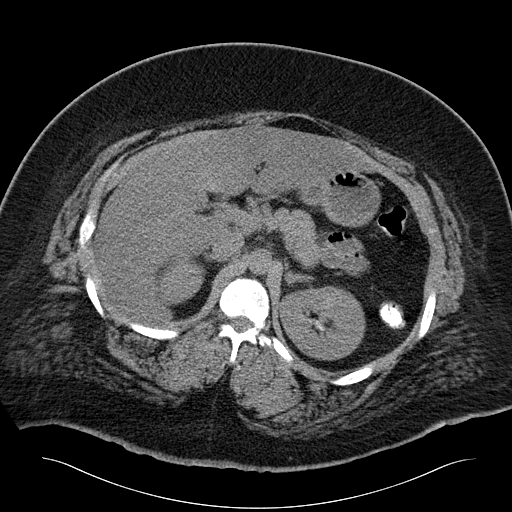
[im 5/60  lung]
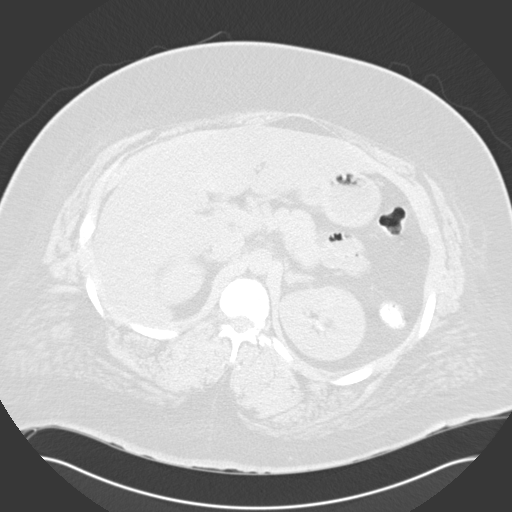
[im 10/60  lung]
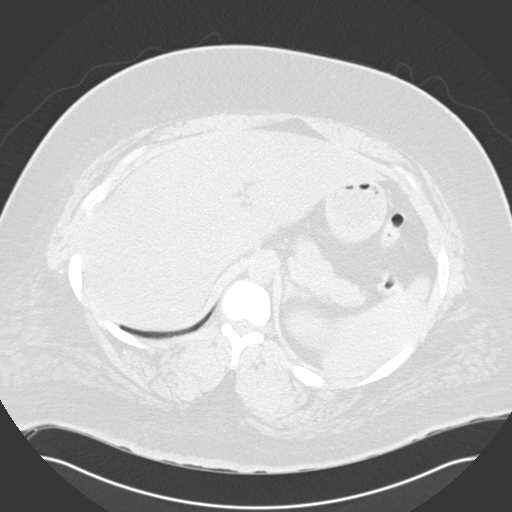
[im 14/60  lung]
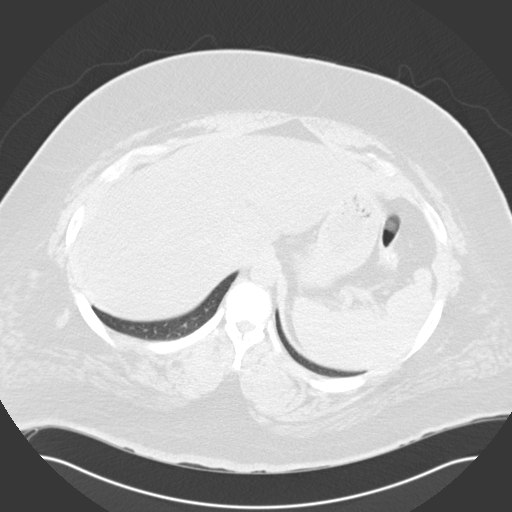
[im 19/60  lung]
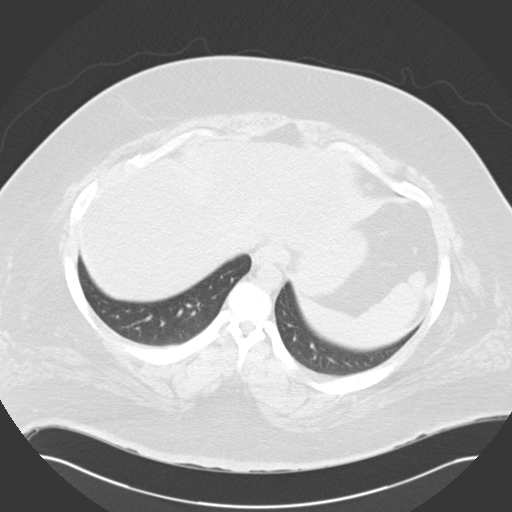
[im 23/60  mediastinal]
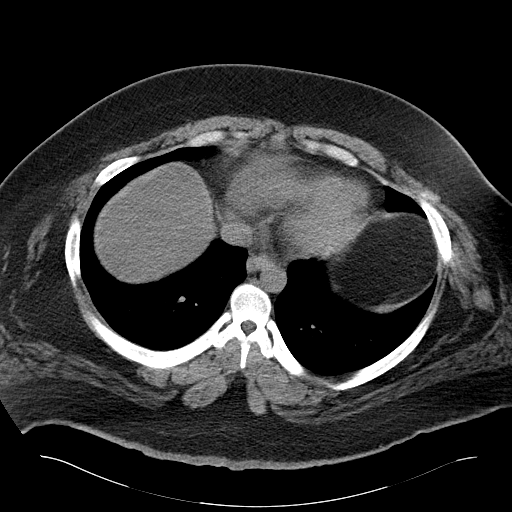
[im 23/60  lung]
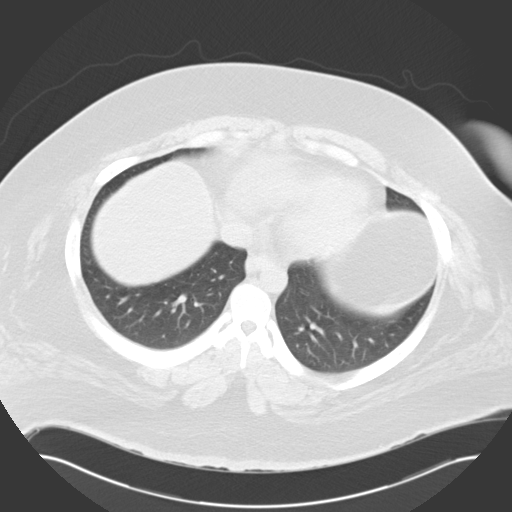
[im 28/60  lung]
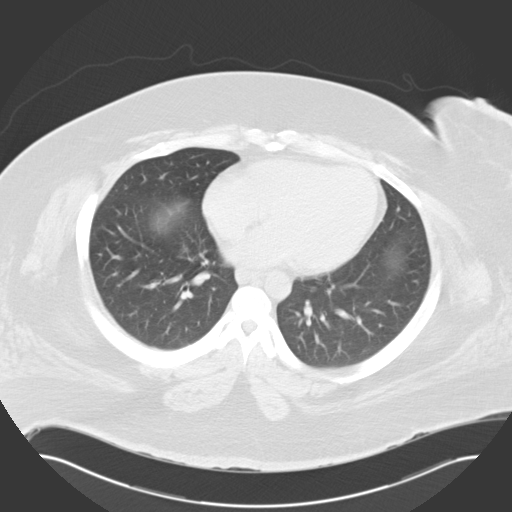
[im 29/60  lung]
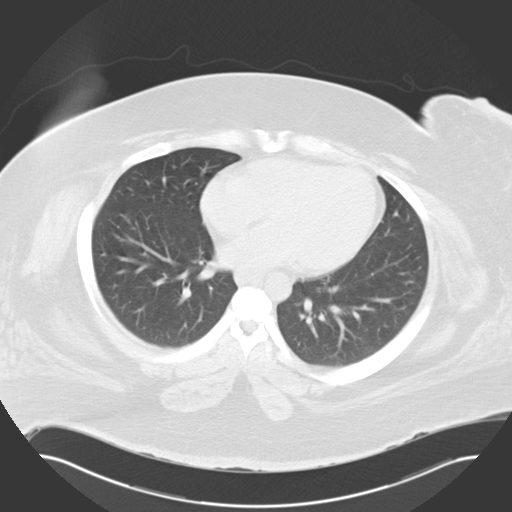
[im 30/60  lung]
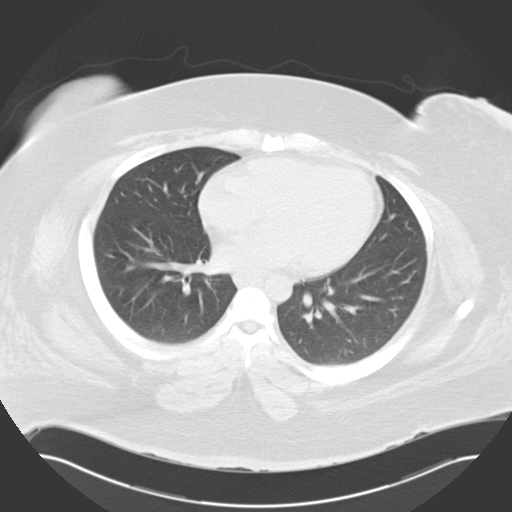
[im 32/60  mediastinal]
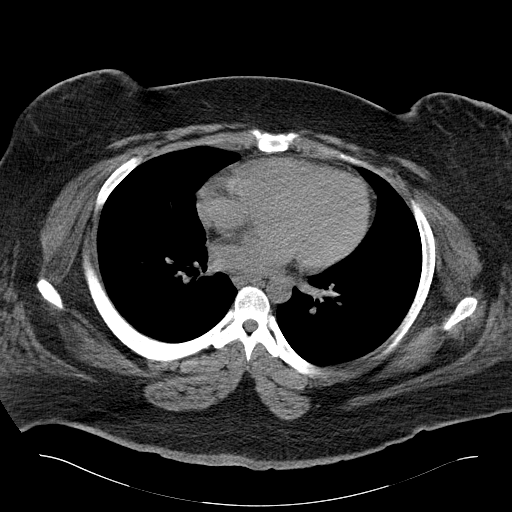
[im 32/60  lung]
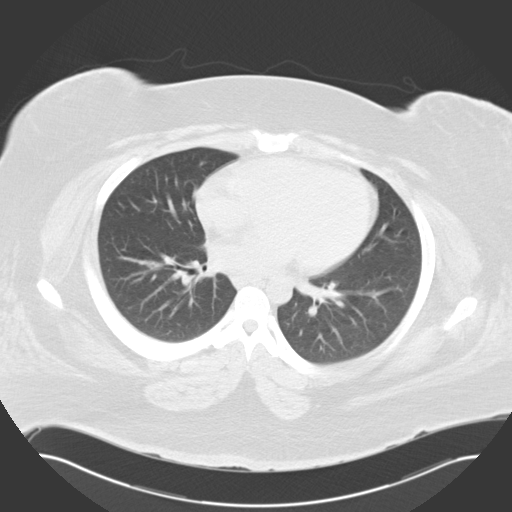
[im 37/60  lung]
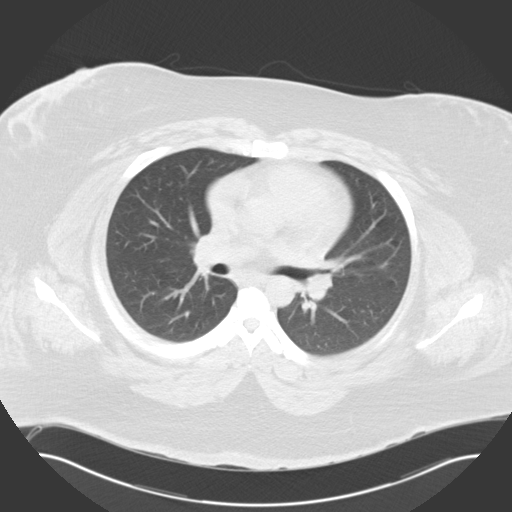
[im 41/60  lung]
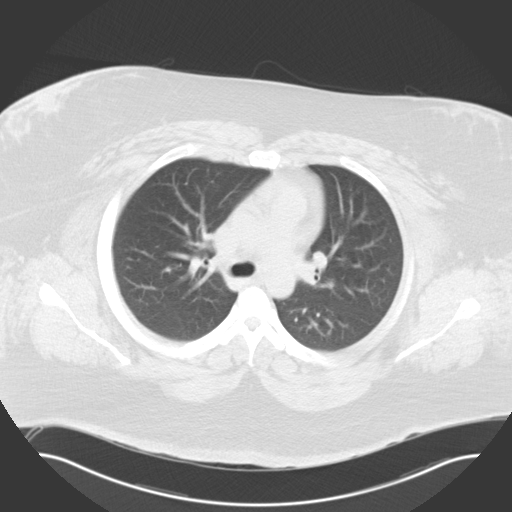
[im 46/60  lung]
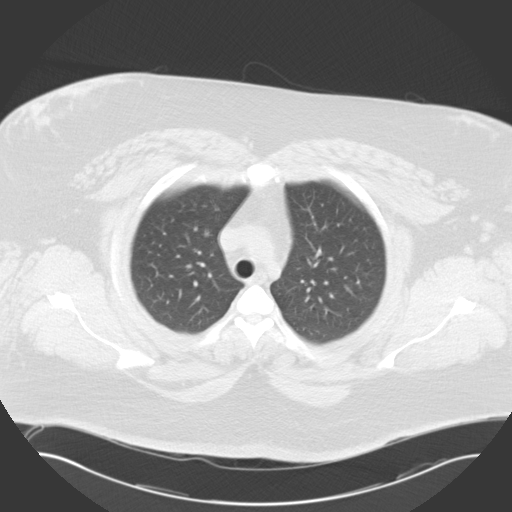
[im 50/60  mediastinal]
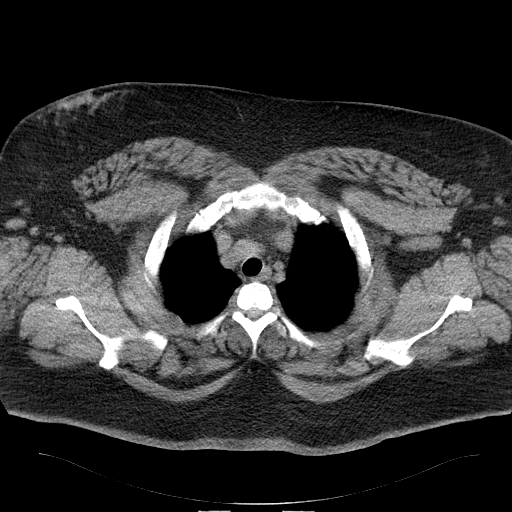
[im 50/60  lung]
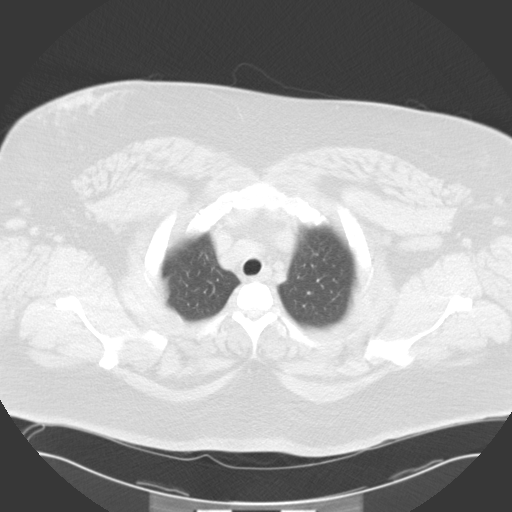
[im 55/60  lung]
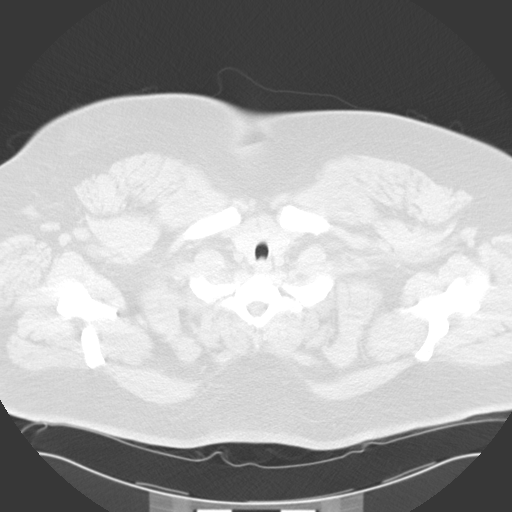

[14 of 32 positions shown; findings below may reference images not displayed]

FINDINGS: Evaluation of this exam is limited in the absence of intravenous
contrast.

The lungs are clear. There is no pleural effusion or pneumothorax.
The central airways are patent.

The thoracic aorta and central pulmonary arteries appear grossly
unremarkable on this noncontrast study. There is no cardiomegaly or
pericardial effusion. No hilar or mediastinal adenopathy. The
esophagus is grossly unremarkable. No thyroid nodules identified.

There is no axillary adenopathy. The chest wall soft tissues appear
unremarkable. The osseous structures are intact.

Diffuse hepatic steatosis. High density in the visualized portion of
the upper pole of the kidneys may represent residual contrast from
earlier study.
IMPRESSION: No acute intrathoracic pathology.

Fatty liver.

## 2017-09-07 IMAGING — CR DG CHEST 2V
1 series · 2 of 2 positions shown · non-contrast
Comparison: None.

CLINICAL DATA: Persistent LEFT abdominal pain with nausea vomiting,
chest pain and shortness of breath. Diagnosed with gastritis.
History of diabetes.

EXAM:
CHEST  2 VIEW

[Series 1: dg chest 2 view · 0.14mm/px · 2 of 2 slices shown]
[im 1/2]
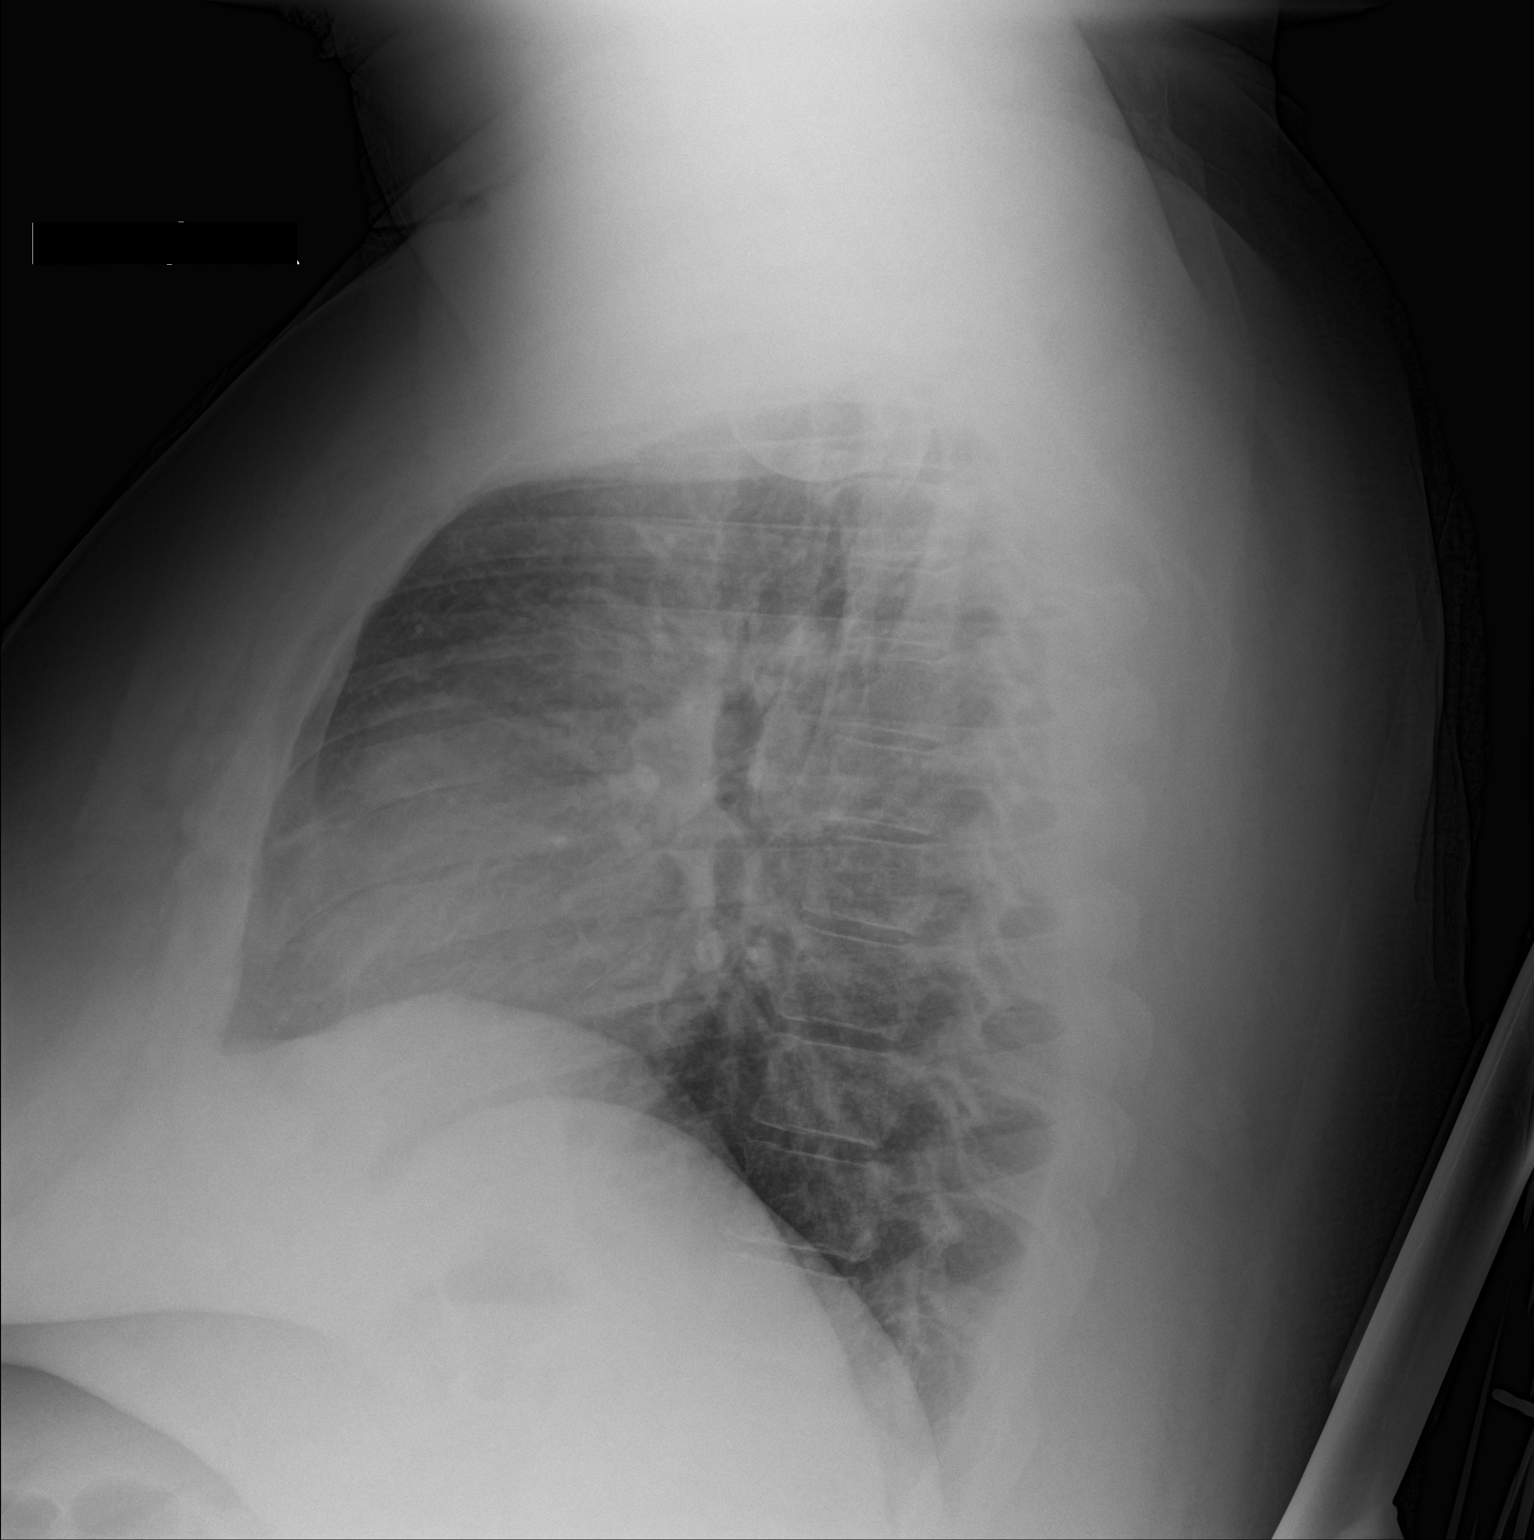
[im 2/2]
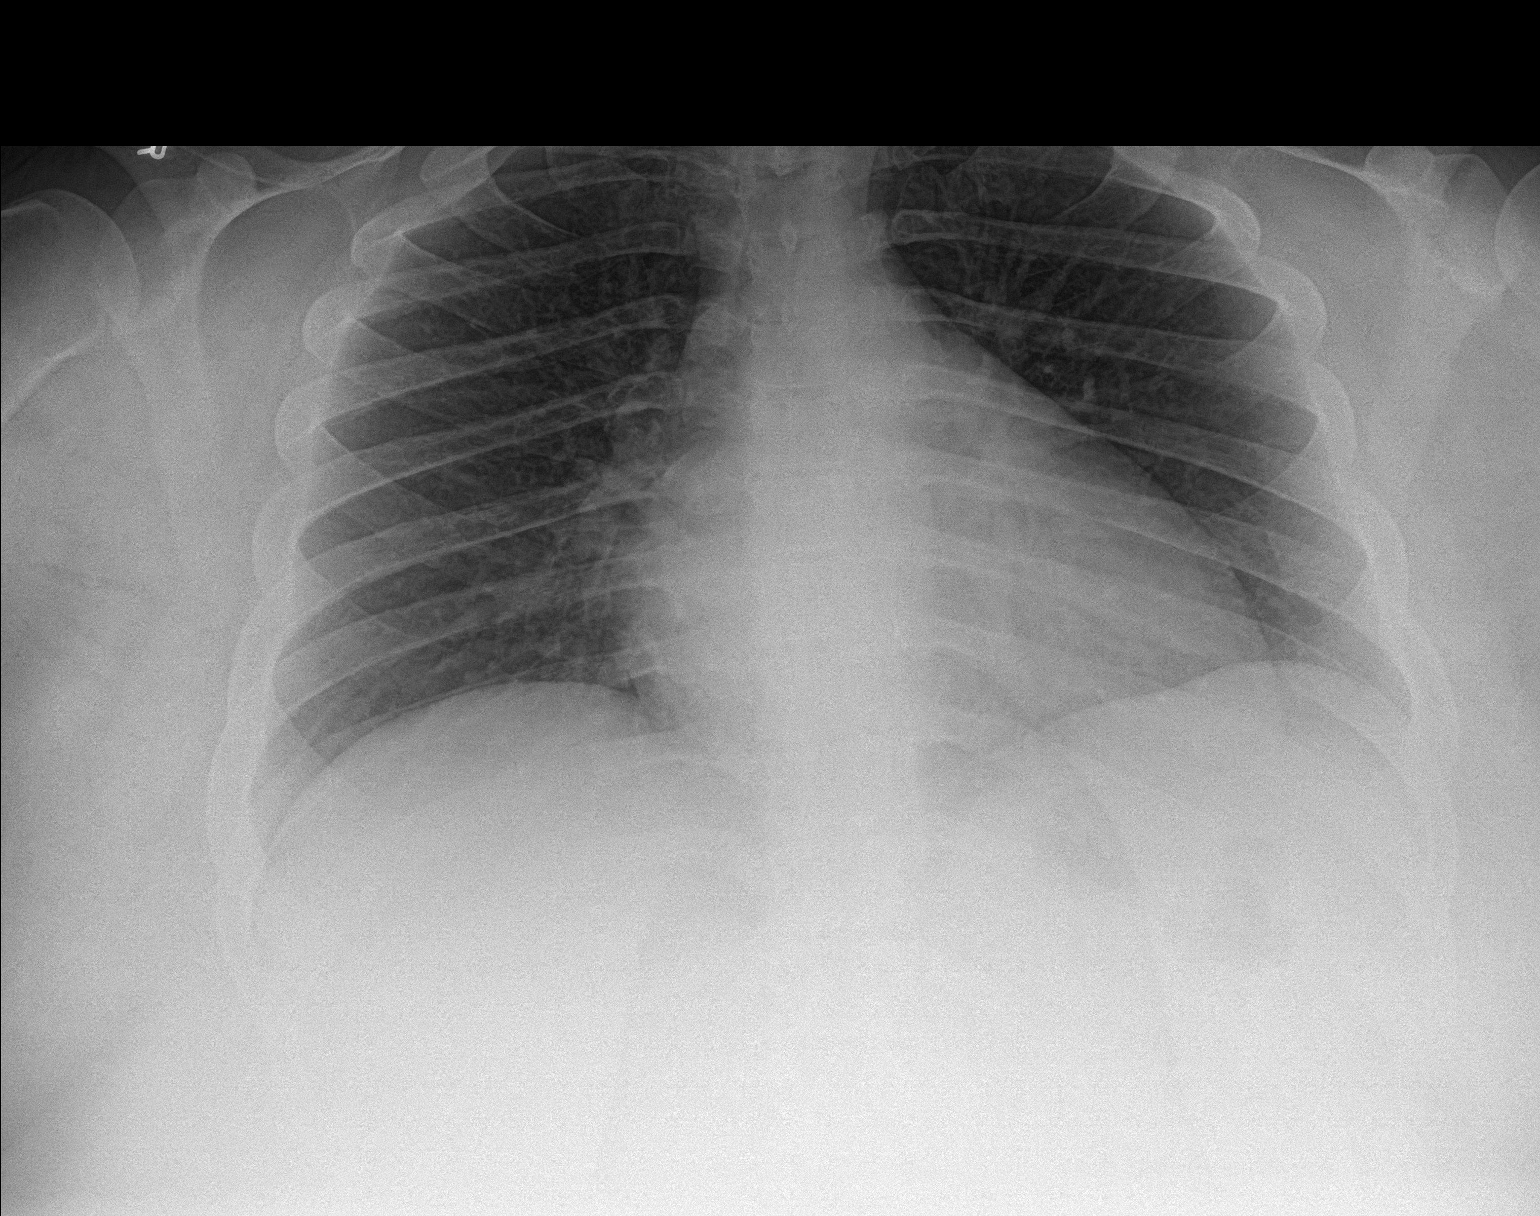

[2 of 2 positions shown; findings below may reference images not displayed]

FINDINGS: Cardiomediastinal silhouette is unremarkable for this low
inspiratory examination with crowded vasculature markings. The lungs
are clear without pleural effusions or focal consolidations. Trachea
projects midline and there is no pneumothorax. Included soft tissue
planes and osseous structures are non-suspicious. Large body
habitus.
IMPRESSION: No acute cardiopulmonary process for this low inspiratory
examination.

## 2017-10-12 ENCOUNTER — Telehealth: Payer: Self-pay | Admitting: Family Medicine

## 2017-10-12 NOTE — Telephone Encounter (Signed)
Called to schedule Medicare Annual Wellness Visit with Nurse Health Advisor. If patient returns call, please note: their last AWV was on 07/09/15 please schedule AWV with [facility] any date  Thank you! For any questions please contact: Manuela SchwartzKathryn Brown [phone]  Skype [email or web address] [email or web address]

## 2017-10-23 NOTE — Telephone Encounter (Signed)
I left a message asking the pt to call and schedule AWV. VDM (DD) °

## 2017-11-12 NOTE — Telephone Encounter (Signed)
LMTCB. -MM 

## 2017-11-12 NOTE — Telephone Encounter (Signed)
This encounter was created in error - please disregard.

## 2017-11-23 NOTE — Telephone Encounter (Signed)
Pt has not returned our calls or scheduled an apt as requested. Closing TE. -MM

## 2017-12-22 ENCOUNTER — Other Ambulatory Visit: Payer: Self-pay

## 2017-12-22 ENCOUNTER — Emergency Department
Admission: EM | Admit: 2017-12-22 | Discharge: 2017-12-22 | Disposition: A | Discharge status: Home / Self Care | Payer: Medicare Other | Attending: Emergency Medicine | Admitting: Emergency Medicine

## 2017-12-22 DIAGNOSIS — B9789 Other viral agents as the cause of diseases classified elsewhere: Secondary | ICD-10-CM | POA: Diagnosis not present

## 2017-12-22 DIAGNOSIS — Z79899 Other long term (current) drug therapy: Secondary | ICD-10-CM | POA: Diagnosis not present

## 2017-12-22 DIAGNOSIS — J069 Acute upper respiratory infection, unspecified: Secondary | ICD-10-CM

## 2017-12-22 DIAGNOSIS — Z7984 Long term (current) use of oral hypoglycemic drugs: Secondary | ICD-10-CM | POA: Insufficient documentation

## 2017-12-22 DIAGNOSIS — I10 Essential (primary) hypertension: Secondary | ICD-10-CM | POA: Diagnosis not present

## 2017-12-22 DIAGNOSIS — E119 Type 2 diabetes mellitus without complications: Secondary | ICD-10-CM | POA: Insufficient documentation

## 2017-12-22 DIAGNOSIS — F17210 Nicotine dependence, cigarettes, uncomplicated: Secondary | ICD-10-CM | POA: Insufficient documentation

## 2017-12-22 DIAGNOSIS — R05 Cough: Secondary | ICD-10-CM | POA: Diagnosis present

## 2017-12-22 MED ORDER — GUAIFENESIN-CODEINE 100-10 MG/5ML PO SYRP: 5.0000 mL | ORAL_SOLUTION | Freq: Three times a day (TID) | ORAL | 0 refills | Status: DC | PRN

## 2017-12-22 NOTE — Discharge Instructions (Signed)
Please take your medications as prescribed. Monitor your blood pressure and see your primary care provider if it is not back down to normal for you. Return to the ER for symptoms that change or worsen if unable to schedule an appointment.

## 2017-12-22 NOTE — ED Provider Notes (Signed)
[facility] Emergency Department Provider Note  ____________________________________________  Time seen: Approximately 5:18 PM  I have reviewed the triage vital signs and the nursing notes.   HISTORY  Chief Complaint sinus congestion and Cough   HPI Casey Reese is a 27 y.o. female who presents to the emergency department for treatment and evaluation of sinus pressure, cough, and nasal congestion that started 3 days ago.  Patient states that her cough got worse last night.  No known fever.  No relief with DayQuil or NyQuil.   Past Medical History:  Diagnosis Date  . Diabetes mellitus without complication ([place])   . History of chicken pox   . Hypertension   . S/P above knee amputation ([place])   . Status post above knee amputation ([place]) 04/28/2001    Patient Active Problem List   Diagnosis Date Noted  . GERD (gastroesophageal reflux disease) 05/23/2016  . Helicobacter pylori gastritis 07/09/2015  . Essential hypertension 06/29/2015  . Diabetes mellitus ([place]) 06/04/2015  . Morbid obesity ([place]) 01/18/2008  . Status post above knee amputation ([place]) 04/28/2001  . Acquired absence of lower extremity above knee ([place]) 04/28/2001    Past Surgical History:  Procedure Laterality Date  . ABOVE KNEE LEG AMPUTATION Left 2003  . HIP FUSION Left     Prior to Admission medications   Medication Sig Start Date End Date Taking? Authorizing Provider  acetaminophen (TYLENOL) 500 MG tablet One or two 3 x day for aches 06/26/16   Carmon Ginsberg, PA  albuterol (PROVENTIL HFA) 108 (90 Base) MCG/ACT inhaler Inhale 2 puffs into the lungs every 6 (six) hours as needed. 02/03/17   Birdie Sons, MD  blood glucose meter kit and supplies KIT Dispense based on patient and insurance preference to check sugar once daily. (FOR ICD-9 250.00) 03/06/17   Birdie Sons, MD  chlorthalidone (HYGROTON) 25 MG tablet Take 1 tablet (25 mg total) by mouth daily. 03/17/16   Birdie Sons, MD  diltiazem (CARTIA XT) 120 MG 24 hr capsule TAKE 1 CAPSULE BY MOUTH EVERY DAY 06/03/17   Birdie Sons, MD  ferrous gluconate (FERGON) 324 MG tablet Take 1 tablet (324 mg total) by mouth 2 (two) times daily with a meal. 06/19/17   Birdie Sons, MD  guaiFENesin-codeine (ROBITUSSIN AC) 100-10 MG/5ML syrup Take 5 mLs by mouth 3 (three) times daily as needed for cough. 12/22/17   Sherrie George B, FNP  metFORMIN (GLUCOPHAGE-XR) 500 MG 24 hr tablet Take two tablets daily 03/17/16   Birdie Sons, MD  omeprazole (PRILOSEC) 10 MG capsule Take 1 capsule (10 mg total) by mouth daily. 05/09/17   Cuthriell, Charline Bills, PA-C  ranitidine (ZANTAC) 150 MG tablet Take 1 tablet (150 mg total) by mouth 2 (two) times daily. 06/19/17 06/19/18  Birdie Sons, MD    Allergies Sulfa antibiotics  Family History  Problem Relation Age of Onset  . Diabetes Mother        type 2    Social History Social History   Tobacco Use  . Smoking status: Current Some Day Smoker    Types: Cigarettes  . Smokeless tobacco: Never Used  . Tobacco comment: 1 pack every 2 weeks. Started smoking at age 43  Substance Use Topics  . Alcohol use: Yes    Alcohol/week: 0.0 standard drinks    Comment: occasionally  . Drug use: No    Review of Systems Constitutional: Negative for fever/chills ENT: Negative for sore throat.  Positive for clear rhinorrhea and sinus congestion Cardiovascular: Denies chest pain. Respiratory: Negative for shortness of breath.  Positive for cough. Gastrointestinal: Negative for nausea, no vomiting.  No diarrhea.  Musculoskeletal: Negative for body aches Skin: Negative for rash. Neurological: Negative for headaches ____________________________________________   PHYSICAL EXAM:  VITAL SIGNS: ED Triage Vitals [12/22/17 1656]  Enc Vitals Group     BP (!) 182/99     Pulse Rate 100     Resp 18     Temp 98.4 F (36.9 C)     Temp Source Oral     SpO2 98 %     Weight 268 lb (121.6 kg)      Height 5' 6"  (1.676 m)     Head Circumference      Peak Flow      Pain Score 7     Pain Loc      Pain Edu?      Excl. in [place]?     Constitutional: Alert and oriented.  Well appearing and in no acute distress. Eyes: Conjunctivae are normal. EOMI. Ears: Bilateral TM normal Nose: Maxillary congestion noted; clear rhinnorhea. Mouth/Throat: Mucous membranes are moist.  Oropharynx normal. Tonsils flat. Neck: No stridor.  Lymphatic: No cervical lymphadenopathy. Cardiovascular: Normal rate, regular rhythm. Good peripheral circulation. Respiratory: Normal respiratory effort.  No retractions.  Breath sounds clear to auscultation throughout. Gastrointestinal: Soft and nontender.  Musculoskeletal: FROM x 4 extremities.  Neurologic:  Normal speech and language.  Skin:  Skin is warm, dry and intact. No rash noted. Psychiatric: Mood and affect are normal. Speech and behavior are normal.  ____________________________________________   LABS (all labs ordered are listed, but only abnormal results are displayed)  Labs Reviewed - No data to display ____________________________________________  EKG  Not indicated ____________________________________________  RADIOLOGY  Not indicated ____________________________________________   PROCEDURES  Procedure(s) performed: None  Critical Care performed: No ____________________________________________   INITIAL IMPRESSION / ASSESSMENT AND PLAN / ED COURSE  27 y.o. female who presents to the emergency department for treatment and evaluation of URI symptoms that started 3 days ago with increase in frequency of cough last night.  She has been taking DayQuil and NyQuil.  Patient is noted to be hypertensive and states that she has not taken her medications in the past couple of days.  She was advised to avoid over-the-counter cold and cough medications unless cleared by the pharmacist or her PCP as she has both a history of hypertension and  diabetes.  She was strongly encouraged to take her medications as prescribed and does state that she has them at home to take.  No indication of infectious process including pneumonia or bacterial sinusitis.   Patient will be given a prescription for Robitussin-AC and encouraged to follow-up with her primary care provider for symptoms that are not improving over the next few days.  She was encouraged to return to the emergency department for symptoms of change or worsen if she is unable to schedule an appointment.   Medications - No data to display  ED Discharge Orders         Ordered    guaiFENesin-codeine (ROBITUSSIN AC) 100-10 MG/5ML syrup  3 times daily PRN     12/22/17 1735           Pertinent labs & imaging results that were available during my care of the patient were reviewed by me and considered in my medical decision making (see chart for details).    If controlled substance  prescribed during this visit, 12 month history viewed on the [place] prior to issuing an initial prescription for Schedule II or III opiod. ____________________________________________   FINAL CLINICAL IMPRESSION(S) / ED DIAGNOSES  Final diagnoses:  Viral URI with cough  Hypertension, unspecified type    Note:  This document was prepared using Dragon voice recognition software and may include unintentional dictation errors.     Victorino Dike, FNP 12/22/17 1736    Darel Hong, MD 12/23/17 2143

## 2017-12-22 NOTE — ED Triage Notes (Signed)
Sinus pressure, cough, "stopped up" nose that began last night

## 2017-12-22 NOTE — ED Notes (Signed)
See triage note  Presents with sinus pressure for a couple of days  "nose is stopped up"

## 2018-06-04 ENCOUNTER — Telehealth: Payer: Self-pay | Admitting: Family Medicine

## 2018-06-07 ENCOUNTER — Ambulatory Visit: Payer: Medicare Other | Admitting: Family Medicine

## 2018-06-08 ENCOUNTER — Ambulatory Visit: Payer: Medicare Other | Admitting: Family Medicine

## 2018-06-09 ENCOUNTER — Encounter: Payer: Self-pay | Admitting: Family Medicine

## 2018-06-09 ENCOUNTER — Ambulatory Visit (INDEPENDENT_AMBULATORY_CARE_PROVIDER_SITE_OTHER): Payer: Medicare Other | Admitting: Family Medicine

## 2018-06-09 ENCOUNTER — Other Ambulatory Visit: Payer: Self-pay | Admitting: Family Medicine

## 2018-06-09 VITALS — BP 138/78 | HR 93 | Temp 97.7°F | Resp 16 | Wt 278.8 lb

## 2018-06-09 DIAGNOSIS — E119 Type 2 diabetes mellitus without complications: Secondary | ICD-10-CM

## 2018-06-09 DIAGNOSIS — R109 Unspecified abdominal pain: Secondary | ICD-10-CM

## 2018-06-09 MED ORDER — FERROUS GLUCONATE 324 (38 FE) MG PO TABS: 324.0000 mg | ORAL_TABLET | Freq: Every day | ORAL | 5 refills | Status: AC

## 2018-06-09 MED ORDER — SUCRALFATE 1 G PO TABS: ORAL_TABLET | ORAL | Status: AC

## 2018-06-09 MED ORDER — OMEPRAZOLE 40 MG PO CPDR: 40.0000 mg | DELAYED_RELEASE_CAPSULE | Freq: Every day | ORAL | 2 refills | Status: AC

## 2018-06-09 MED ORDER — OMEPRAZOLE 40 MG PO CPDR: 40.0000 mg | DELAYED_RELEASE_CAPSULE | Freq: Every day | ORAL | 2 refills | Status: DC

## 2018-06-09 NOTE — Progress Notes (Signed)
Patient: Casey Reese Female    DOB: Apr 10, 1991   28 y.o.   MRN: [id number] Visit Date: 06/09/2018  Today's Provider: Lelon Huh, MD   Chief Complaint  Patient presents with  . Abdominal Pain   Subjective:     Abdominal Pain  This is a new problem. The current episode started more than 1 month ago. The onset quality is gradual. The problem occurs intermittently. The problem has been gradually worsening. The pain is located in the LUQ. The pain is at a severity of 9/10. The pain is moderate. The quality of the pain is a sensation of fullness. The abdominal pain does not radiate. Associated symptoms include arthralgias, belching, myalgias, nausea and vomiting. Pertinent negatives include no anorexia, constipation, diarrhea, dysuria, fever, flatus, frequency, headaches, hematochezia, hematuria, melena or weight loss. The pain is aggravated by eating. The pain is relieved by nothing. She has tried antacids and acetaminophen (Zantac) for the symptoms. The treatment provided no relief. Her past medical history is significant for colon cancer and GERD. There is no history of abdominal surgery, Crohn's disease, gallstones, irritable bowel syndrome, pancreatitis, PUD or ulcerative colitis.   She has history of h. Pylori treated in 2017 She was on chronic acid suppressing medications , but has has pain even before she ran out of Zantac a few months ago. Started back on OTC omeprazole about a week ago.   Allergies  Allergen Reactions  . Sulfa Antibiotics     UNKNOWN     Current Outpatient Medications:  .  acetaminophen (TYLENOL) 500 MG tablet, One or two 3 x day for aches, Disp: 30 tablet, Rfl: 0 .  albuterol (PROVENTIL HFA) 108 (90 Base) MCG/ACT inhaler, Inhale 2 puffs into the lungs every 6 (six) hours as needed., Disp: 18 g, Rfl: 3 .  blood glucose meter kit and supplies KIT, Dispense based on patient and insurance preference to check sugar once daily. (FOR ICD-9 250.00), Disp: 1  each, Rfl: 0 .  ferrous gluconate (FERGON) 324 MG tablet, Take 1 tablet (324 mg total) by mouth 2 (two) times daily with a meal., Disp: 60 tablet, Rfl: 5 .  metFORMIN (GLUCOPHAGE-XR) 500 MG 24 hr tablet, Take two tablets daily, Disp: 180 tablet, Rfl: 1 .  chlorthalidone (HYGROTON) 25 MG tablet, Take 1 tablet (25 mg total) by mouth daily. (Patient not taking: Reported on 06/09/2018), Disp: 90 tablet, Rfl: 1 .  diltiazem (CARTIA XT) 120 MG 24 hr capsule, TAKE 1 CAPSULE BY MOUTH EVERY DAY (Patient not taking: Reported on 06/09/2018), Disp: 30 capsule, Rfl: 3  Review of Systems  Constitutional: Negative for fever and weight loss.  Gastrointestinal: Positive for abdominal pain, nausea and vomiting. Negative for anorexia, constipation, diarrhea, flatus, hematochezia and melena.  Genitourinary: Negative for dysuria, frequency and hematuria.  Musculoskeletal: Positive for arthralgias and myalgias.  Neurological: Negative for headaches.    Social History   Tobacco Use  . Smoking status: Current Some Day Smoker    Types: Cigarettes  . Smokeless tobacco: Never Used  . Tobacco comment: 1 pack every 2 weeks. Started smoking at age 38  Substance Use Topics  . Alcohol use: Yes    Alcohol/week: 0.0 standard drinks    Comment: occasionally      Objective:   BP 138/78   Pulse 93   Temp 97.7 F (36.5 C) (Oral)   Resp 16   Wt 278 lb 12.8 oz (126.5 kg)   LMP 06/05/2018 (Exact Date)  SpO2 98%   BMI 45.00 kg/m  Vitals:   06/09/18 1531  BP: 138/78  Pulse: 93  Resp: 16  Temp: 97.7 F (36.5 C)  TempSrc: Oral  SpO2: 98%  Weight: 278 lb 12.8 oz (126.5 kg)     Physical Exam  General Appearance:    Alert, cooperative, no distress, obese  Eyes:    PERRL, conjunctiva/corneas clear, EOM's intact       Lungs:     Clear to auscultation bilaterally, respirations unlabored  Heart:    Regular rate and rhythm  Abdomen:   Diffuse upper abdominal tenderness, worse on left.       Assessment & Plan      1. Abdominal pain, unspecified abdominal location She is to start back on higher dose PPI and sucralfate. Check labs. refill- ferrous gluconate (FERGON) 324 MG tablet; Take 1 tablet (324 mg total) by mouth daily.  Dispense: 30 tablet; Refill: 5 - CBC - Comprehensive metabolic panel - Amylase - H. pylori breath test - sucralfate (CARAFATE) 1 g tablet; TAKE 1 TABLET(1 GRAM) BY MOUTH FOUR TIMES DAILY WITH MEALS AND AT BEDTIME  Dispense: 120 tablet; Refill: 00 - omeprazole (PRILOSEC) 40 MG capsule; Take 1 capsule (40 mg total) by mouth daily.  Dispense: 30 capsule; Refill: 2  2. Type 2 diabetes mellitus without complication, without long-term current use of insulin (HCC) Is due for - Hemoglobin A1c     Donald Fisher, MD  [facility] 

## 2018-06-09 NOTE — Patient Instructions (Signed)
.   Please review the attached list of medications and notify my office if there are any errors.   . Please bring all of your medications to every appointment so we can make sure that our medication list is the same as yours.   

## 2018-06-16 MED ORDER — ONDANSETRON 4 MG PO TBDP: 4.0000 mg | ORAL_TABLET | Freq: Three times a day (TID) | ORAL | 1 refills | Status: DC | PRN

## 2018-06-16 NOTE — Telephone Encounter (Signed)
Pt requesting a refill Zofran due to nausea that was discuss at appt.  Please fill at:    [facility] #[id number] [place], [place] - [address] AT [facility] OF [address] [phone] (Phone) [phone] (Fax)   Thanks, [facility]

## 2018-08-31 ENCOUNTER — Encounter: Payer: Self-pay | Admitting: Family Medicine

## 2018-08-31 ENCOUNTER — Other Ambulatory Visit: Payer: Self-pay

## 2018-08-31 ENCOUNTER — Ambulatory Visit (INDEPENDENT_AMBULATORY_CARE_PROVIDER_SITE_OTHER): Payer: Medicare Other | Admitting: Family Medicine

## 2018-08-31 VITALS — BP 148/85 | HR 106 | Temp 98.3°F | Wt 264.0 lb

## 2018-08-31 DIAGNOSIS — E119 Type 2 diabetes mellitus without complications: Secondary | ICD-10-CM | POA: Diagnosis not present

## 2018-08-31 DIAGNOSIS — L089 Local infection of the skin and subcutaneous tissue, unspecified: Secondary | ICD-10-CM | POA: Diagnosis not present

## 2018-08-31 DIAGNOSIS — L723 Sebaceous cyst: Secondary | ICD-10-CM | POA: Diagnosis not present

## 2018-08-31 LAB — POCT GLYCOSYLATED HEMOGLOBIN (HGB A1C): Hemoglobin A1C: 7.3 % — AB (ref 4.0–5.6)

## 2018-08-31 MED ORDER — AMOXICILLIN-POT CLAVULANATE 875-125 MG PO TABS: 1.0000 | ORAL_TABLET | Freq: Two times a day (BID) | ORAL | 0 refills | Status: AC

## 2018-08-31 NOTE — Patient Instructions (Signed)
.   Please review the attached list of medications and notify my office if there are any errors.   . Please bring all of your medications to every appointment so we can make sure that our medication list is the same as yours.   

## 2018-08-31 NOTE — Progress Notes (Signed)
     Patient: Casey Reese Female    DOB: 06/03/1990   28 y.o.   MRN: [id number] Visit Date: 08/31/2018  Today's Provider: Donald Fisher, MD   Chief Complaint  Patient presents with  . Cyst   Subjective:     HPI   Abscess: Patient presents for evaluation of a cutaneous abscess. Lesion is located in the left thigh. Onset was 2 days ago. Symptoms have gradually worsened. Abscess has associated symptoms of spontaneous drainage, pain. Patient does have previous history of cutaneous abscesses. Patient does have diabetes.she is overdue for a1c. Is taking metformin consistently every day.     Allergies  Allergen Reactions  . Sulfa Antibiotics     UNKNOWN     Current Outpatient Medications:  .  acetaminophen (TYLENOL) 500 MG tablet, One or two 3 x day for aches, Disp: 30 tablet, Rfl: 0 .  albuterol (PROVENTIL HFA) 108 (90 Base) MCG/ACT inhaler, Inhale 2 puffs into the lungs every 6 (six) hours as needed., Disp: 18 g, Rfl: 3 .  blood glucose meter kit and supplies KIT, Dispense based on patient and insurance preference to check sugar once daily. (FOR ICD-9 250.00), Disp: 1 each, Rfl: 0 .  diltiazem (CARTIA XT) 120 MG 24 hr capsule, TAKE 1 CAPSULE BY MOUTH EVERY DAY, Disp: 30 capsule, Rfl: 3 .  ferrous gluconate (FERGON) 324 MG tablet, Take 1 tablet (324 mg total) by mouth daily., Disp: 30 tablet, Rfl: 5 .  metFORMIN (GLUCOPHAGE-XR) 500 MG 24 hr tablet, Take two tablets daily, Disp: 180 tablet, Rfl: 1 .  omeprazole (PRILOSEC) 40 MG capsule, Take 1 capsule (40 mg total) by mouth daily., Disp: 30 capsule, Rfl: 2 .  ondansetron (ZOFRAN ODT) 4 MG disintegrating tablet, Take 1 tablet (4 mg total) by mouth every 8 (eight) hours as needed for nausea or vomiting., Disp: 20 tablet, Rfl: 1 .  sucralfate (CARAFATE) 1 g tablet, TAKE 1 TABLET(1 GRAM) BY MOUTH FOUR TIMES DAILY WITH MEALS AND AT BEDTIME, Disp: 120 tablet, Rfl: 00 .  chlorthalidone (HYGROTON) 25 MG tablet, Take 1 tablet (25 mg  total) by mouth daily. (Patient not taking: Reported on 08/31/2018), Disp: 90 tablet, Rfl: 1  Review of Systems  Constitutional: Negative.   Skin: Positive for wound. Negative for color change, pallor and rash.    Social History   Tobacco Use  . Smoking status: Current Some Day Smoker    Types: Cigarettes  . Smokeless tobacco: Never Used  . Tobacco comment: 1 pack every 2 weeks. Started smoking at age 16  Substance Use Topics  . Alcohol use: Yes    Alcohol/week: 0.0 standard drinks    Comment: occasionally      Objective:   BP (!) 148/85 (BP Location: Left Arm, Patient Position: Sitting, Cuff Size: Large)   Pulse (!) 106   Temp 98.3 F (36.8 C) (Oral)   Wt 264 lb (119.7 kg)   BMI 42.61 kg/m  Vitals:   08/31/18 1536  BP: (!) 148/85  Pulse: (!) 106  Temp: 98.3 F (36.8 C)  TempSrc: Oral  Weight: 264 lb (119.7 kg)     Physical Exam  General appearance: alert, well developed, well nourished, cooperative and in no distress Head: Normocephalic, without obvious abnormality, atraumatic Respiratory: Respirations even and unlabored, normal respiratory rate Extremities: tender draining cyst left proximal inner thigh. Slight redness.   Results for orders placed or performed in visit on 08/31/18  POCT glycosylated hemoglobin (Hb A1C)  Result Value   Ref Range   Hemoglobin A1C 7.3 (A) 4.0 - 5.6 %       Assessment & Plan    1. Infected sebaceous cyst  - amoxicillin-clavulanate (AUGMENTIN) 875-125 MG tablet; Take 1 tablet by mouth 2 (two) times daily for 10 days.  Dispense: 20 tablet; Refill: 0 - Aerobic Culture  2. Type 2 diabetes mellitus without complication, without long-term current use of insulin (HCC)  - POCT glycosylated hemoglobin (Hb A1C)      Donald Fisher, MD  [facility]  

## 2018-09-01 DIAGNOSIS — L089 Local infection of the skin and subcutaneous tissue, unspecified: Secondary | ICD-10-CM | POA: Diagnosis not present

## 2018-09-01 DIAGNOSIS — L723 Sebaceous cyst: Secondary | ICD-10-CM | POA: Diagnosis not present

## 2018-09-03 LAB — AEROBIC CULTURE

## 2018-09-08 ENCOUNTER — Telehealth: Payer: Self-pay

## 2018-09-08 NOTE — Telephone Encounter (Signed)
LMTCB 09/08/2018  Thanks,   -Montague Corella  

## 2018-09-08 NOTE — Telephone Encounter (Signed)
-----   Message from Malva Limes, MD sent at 09/03/2018 12:51 PM EDT ----- Wound culture is negative. Continue taking Augmentin daily, call if not cleared up by them time she finishes antibiotic.

## 2018-09-09 NOTE — Telephone Encounter (Signed)
Patient was advised of results. Expressed understanding.  

## 2018-10-20 ENCOUNTER — Telehealth: Payer: Self-pay | Admitting: Family Medicine

## 2018-10-20 NOTE — Chronic Care Management (AMB) (Signed)
°  Chronic Care Management   Outreach Note  10/20/2018 Name: Casey Reese MRN: [id number] DOB: 09-17-1990  Referred by: Birdie Sons, MD Reason for referral : Chronic Care Management (Initial CCM outreach was unsuccessful. )   Third unsuccessful telephone outreach was attempted today. The patient was referred to the case management team for assistance with chronic care management and care coordination. The patient's primary care provider has been notified of our unsuccessful attempts to make or maintain contact with the patient. The care management team is pleased to engage with this patient at any time in the future should he/she be interested in assistance from the care management team.   Follow Up Plan: The care management team is available to follow up with the patient after provider conversation with the patient regarding recommendation for care management engagement and subsequent re-referral to the care management team.   [place]  ??[email or web address]   ??[phone]

## 2018-10-20 NOTE — Telephone Encounter (Signed)
Pt returned missed call. °Please call pt back if needed. ° °Thanks, °TGH °

## 2019-05-06 ENCOUNTER — Other Ambulatory Visit: Payer: Self-pay

## 2019-05-06 ENCOUNTER — Encounter: Payer: Self-pay | Admitting: Emergency Medicine

## 2019-05-06 ENCOUNTER — Emergency Department
Admission: EM | Admit: 2019-05-06 | Discharge: 2019-05-06 | Disposition: A | Discharge status: Home / Self Care | Payer: Medicare Other | Attending: Emergency Medicine | Admitting: Emergency Medicine

## 2019-05-06 ENCOUNTER — Emergency Department: Payer: Medicare Other

## 2019-05-06 DIAGNOSIS — Z7984 Long term (current) use of oral hypoglycemic drugs: Secondary | ICD-10-CM | POA: Diagnosis not present

## 2019-05-06 DIAGNOSIS — I10 Essential (primary) hypertension: Secondary | ICD-10-CM | POA: Diagnosis not present

## 2019-05-06 DIAGNOSIS — N390 Urinary tract infection, site not specified: Secondary | ICD-10-CM | POA: Diagnosis not present

## 2019-05-06 DIAGNOSIS — M545 Low back pain, unspecified: Secondary | ICD-10-CM

## 2019-05-06 DIAGNOSIS — Z79899 Other long term (current) drug therapy: Secondary | ICD-10-CM | POA: Diagnosis not present

## 2019-05-06 DIAGNOSIS — E119 Type 2 diabetes mellitus without complications: Secondary | ICD-10-CM | POA: Insufficient documentation

## 2019-05-06 DIAGNOSIS — M47816 Spondylosis without myelopathy or radiculopathy, lumbar region: Secondary | ICD-10-CM | POA: Diagnosis not present

## 2019-05-06 DIAGNOSIS — R82998 Other abnormal findings in urine: Secondary | ICD-10-CM | POA: Insufficient documentation

## 2019-05-06 DIAGNOSIS — F17210 Nicotine dependence, cigarettes, uncomplicated: Secondary | ICD-10-CM | POA: Insufficient documentation

## 2019-05-06 LAB — URINALYSIS, COMPLETE (UACMP) WITH MICROSCOPIC
Bilirubin Urine: NEGATIVE
Glucose, UA: NEGATIVE mg/dL
Hgb urine dipstick: NEGATIVE
Ketones, ur: NEGATIVE mg/dL
Nitrite: NEGATIVE
Protein, ur: NEGATIVE mg/dL
Specific Gravity, Urine: 1.018 (ref 1.005–1.030)
pH: 5 (ref 5.0–8.0)

## 2019-05-06 LAB — POCT PREGNANCY, URINE: Preg Test, Ur: NEGATIVE

## 2019-05-06 MED ORDER — ACETAMINOPHEN 325 MG PO TABS
650.0000 mg | ORAL_TABLET | Freq: Once | ORAL | Status: AC
  Administered 2019-05-06: 23:00:00 650 mg via ORAL
  Filled 2019-05-06: qty 2

## 2019-05-06 MED ORDER — ACETAMINOPHEN 500 MG PO TABS: 500.0000 mg | ORAL_TABLET | Freq: Four times a day (QID) | ORAL | 0 refills | Status: AC | PRN

## 2019-05-06 MED ORDER — CEPHALEXIN 500 MG PO CAPS
500.0000 mg | ORAL_CAPSULE | Freq: Once | ORAL | Status: AC
  Administered 2019-05-06: 23:00:00 500 mg via ORAL
  Filled 2019-05-06: qty 1

## 2019-05-06 MED ORDER — KETOROLAC TROMETHAMINE 30 MG/ML IJ SOLN: 30.0000 mg | Freq: Once | INTRAMUSCULAR | Status: DC

## 2019-05-06 MED ORDER — BACLOFEN 5 MG PO TABS: 5.0000 mg | ORAL_TABLET | Freq: Three times a day (TID) | ORAL | 0 refills | Status: AC | PRN

## 2019-05-06 MED ORDER — METHOCARBAMOL 500 MG PO TABS
500.0000 mg | ORAL_TABLET | Freq: Once | ORAL | Status: AC
  Administered 2019-05-06: 23:00:00 500 mg via ORAL
  Filled 2019-05-06: qty 1

## 2019-05-06 MED ORDER — CEPHALEXIN 500 MG PO CAPS: 500.0000 mg | ORAL_CAPSULE | Freq: Two times a day (BID) | ORAL | 0 refills | Status: AC

## 2019-05-06 NOTE — ED Triage Notes (Signed)
Patient wheelchair to triage with complaints of lower back pain for 2 days.   Constant dull aching in right lower back.  Pt denies N/V/D/HA/chills/sweating/fever. Pt reports taking tylenol PM before coming to ED without help.  Speaking in complete coherent sentences. No acute breathing distress noted.  Pt reports dark urine and frequency over the last week, denies dysuria

## 2019-05-06 NOTE — ED Provider Notes (Signed)
[facility] Emergency Department Provider Note  ____________________________________________  Time seen: Approximately 9:21 PM  I have reviewed the triage vital signs and the nursing notes.   HISTORY  Chief Complaint Back Pain    HPI Casey Reese is a 29 y.o. female that presents to the emergency department for evaluation of bilateral low back pain for 2 days.  Pain radiates into her sides.  Patient also states that her urine is darker than usual.  No bowel or bladder dysfunction or saddle anesthesias.  She has never had a kidney stone.  No fever, chills, nausea, vomiting, diarrhea, constipation, dysuria.   Past Medical History:  Diagnosis Date  . Diabetes mellitus without complication ([place])   . History of chicken pox   . Hypertension   . S/P above knee amputation ([place])   . 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 Menorrhagia with irregular cycle 07/01/2017  . Iron deficiency anemia 06/29/2017  . Anxiety 06/09/2017  . GERD (gastroesophageal reflux disease) 05/23/2016  . Helicobacter pylori gastritis 07/09/2015  . Hypertension associated with diabetes ([place]) 06/29/2015  . Diabetes mellitus ([place]) 06/04/2015  . Obesity, Class III, BMI 40-49.9 (morbid obesity) ([place]) 01/18/2008  . 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 ABOVE KNEE LEG AMPUTATION Left 2003  . HIP FUSION Left     Prior to Admission medications   Medication Sig Start Date End Date Taking? Authorizing Provider  acetaminophen (TYLENOL) 500 MG tablet Take 1 tablet (500 mg total) by mouth every 6 (six) hours as needed. 05/06/19   Laban Emperor, PA-C  albuterol (PROVENTIL HFA) 108 (90 Base) MCG/ACT inhaler Inhale 2 puffs into the lungs every 6 (six) hours as needed. 02/03/17   Birdie Sons, MD   Baclofen 5 MG TABS Take 5 mg by mouth 3 (three) times daily as needed. 05/06/19   Laban Emperor, PA-C  blood glucose meter kit and supplies KIT Dispense based on patient and insurance preference to check sugar once daily. (FOR ICD-9 250.00) 03/06/17   Birdie Sons, MD  cephALEXin (KEFLEX) 500 MG capsule Take 1 capsule (500 mg total) by mouth 2 (two) times daily for 10 days. 05/06/19 05/16/19  Laban Emperor, PA-C  chlorthalidone (HYGROTON) 25 MG tablet Take 1 tablet (25 mg total) by mouth daily. Patient not taking: Reported on 08/31/2018 03/17/16   Birdie Sons, MD  diltiazem (CARTIA XT) 120 MG 24 hr capsule TAKE 1 CAPSULE BY MOUTH EVERY DAY 06/03/17   Birdie Sons, MD  ferrous gluconate (FERGON) 324 MG tablet Take 1 tablet (324 mg total) by mouth daily. 06/09/18   Birdie Sons, MD  metFORMIN (GLUCOPHAGE-XR) 500 MG 24 hr tablet Take two tablets daily 03/17/16   Birdie Sons, MD  omeprazole (PRILOSEC) 40 MG capsule Take 1 capsule (40 mg total) by mouth daily. 06/09/18   Birdie Sons, MD  ondansetron (ZOFRAN ODT) 4 MG disintegrating tablet Take 1 tablet (4 mg total) by mouth every 8 (eight) hours as needed for nausea or vomiting. 06/16/18   Birdie Sons, MD  sucralfate (CARAFATE) 1 g tablet TAKE 1 TABLET(1 GRAM) BY MOUTH FOUR TIMES DAILY WITH MEALS AND AT BEDTIME 06/09/18   Birdie Sons, MD    Allergies Sulfa antibiotics  Family History  Problem Relation Age  of Onset  . Diabetes Mother        type 2    Social History Social History   Tobacco Use  . Smoking status: Current Some Day Smoker    Types: Cigarettes  . Smokeless tobacco: Never Used  . Tobacco comment: 1 pack every 2 weeks. Started smoking at age 39  Substance Use Topics  . Alcohol use: Yes    Alcohol/week: 0.0 standard drinks    Comment: occasionally  . Drug use: No     Review of Systems  Constitutional: No fever/chills Respiratory: No SOB. Gastrointestinal: No abdominal pain.  No nausea, no  vomiting.  Genitourinary: Negative for dysuria. Musculoskeletal: Positive for low back pain. Skin: Negative for rash, abrasions, lacerations, ecchymosis. Neurological: Negative for numbness or tingling   ____________________________________________   PHYSICAL EXAM:  VITAL SIGNS: ED Triage Vitals  Enc Vitals Group     BP 05/06/19 2050 (!) 175/97     Pulse Rate 05/06/19 2050 99     Resp 05/06/19 2050 18     Temp 05/06/19 2050 99.5 F (37.5 C)     Temp Source 05/06/19 2050 Oral     SpO2 05/06/19 2050 99 %     Weight 05/06/19 2053 242 lb (109.8 kg)     Height 05/06/19 2053 _0  (1.676 m)     Head Circumference --      Peak Flow --      Pain Score 05/06/19 2053 10     Pain Loc --      Pain Edu? --      Excl. in [place]? --      Constitutional: Alert and oriented. Well appearing and in no acute distress. Eyes: Conjunctivae are normal. PERRL. EOMI. Head: Atraumatic. ENT:      Ears:      Nose: No congestion/rhinnorhea.      Mouth/Throat: Mucous membranes are moist.  Neck: No stridor.   Cardiovascular: Normal rate, regular rhythm.  Good peripheral circulation. Respiratory: Normal respiratory effort without tachypnea or retractions. Lungs CTAB. Good air entry to the bases with no decreased or absent breath sounds. Gastrointestinal: Bowel sounds 4 quadrants. Soft and nontender to palpation. No guarding or rigidity. No palpable masses. No distention. No CVA tenderness. Musculoskeletal: Full range of motion to all extremities. No gross deformities appreciated.  Mild tenderness to palpation throughout lumbar spine and lumbar paraspinal muscles. Neurologic:  Normal speech and language. No gross focal neurologic deficits are appreciated.  Skin:  Skin is warm, dry and intact. No rash noted. Psychiatric: Mood and affect are normal. Speech and behavior are normal. Patient exhibits appropriate insight and judgement.   ____________________________________________   LABS (all labs ordered  are listed, but only abnormal results are displayed)  Labs Reviewed  URINALYSIS, COMPLETE (UACMP) WITH MICROSCOPIC - Abnormal; Notable for the following components:      Result Value   Color, Urine YELLOW (*)    APPearance HAZY (*)    Leukocytes,Ua SMALL (*)    Bacteria, UA RARE (*)    All other components within normal limits  POC URINE PREG, ED  POCT PREGNANCY, URINE   ____________________________________________  EKG   ____________________________________________  RADIOLOGY Robinette Haines, personally viewed and evaluated these images (plain radiographs) as part of my medical decision making, as well as reviewing the written report by the radiologist.  DG Lumbar Spine 2-3 Views  Result Date: 05/06/2019 CLINICAL DATA:  Lower back pain for 2-3 days EXAM: LUMBAR SPINE - 2-3 VIEW COMPARISON:  Sep 23, 2013 FINDINGS: There is no evidence of lumbar spine fracture. There is a mild levoconvex scoliotic curvature. Disc height loss with facet arthrosis most notable at L4-L5 and L5-S1. IMPRESSION: Mild lower lumbar spine spondylosis. Electronically Signed   By: Prudencio Pair M.D.   On: 05/06/2019 22:32    ____________________________________________    PROCEDURES  Procedure(s) performed:    Procedures    Medications  methocarbamol (ROBAXIN) tablet 500 mg (500 mg Oral Given 05/06/19 2312)  cephALEXin (KEFLEX) capsule 500 mg (500 mg Oral Given 05/06/19 2312)  acetaminophen (TYLENOL) tablet 650 mg (650 mg Oral Given 05/06/19 2312)     ____________________________________________   INITIAL IMPRESSION / ASSESSMENT AND PLAN / ED COURSE  Pertinent labs & imaging results that were available during my care of the patient were reviewed by me and considered in my medical decision making (see chart for details).  Review of the [place] CSRS was performed in accordance of the [place] prior to dispensing any controlled drugs.     Patient presented to emergency department for evaluation of back  pain.   ----------------------------------------- 10:05 PM on 05/06/2019 ----------------------------------------- Patient states that she has had a bowel movement and that her back has loosened up and pain has improved.   Patient presented to the emergency department for evaluation of low back pain.  Vital signs and exam are reassuring.  X-ray consistent with spondylosis.  Urinalysis consistent with infection.  Patient was given Tylenol, Robaxin for pain.  She is started on Keflex for infection.  Patient will be given prescription for Keflex for infection and baclofen and Tylenol for back pain.  Patient is to follow up with PCP as directed. Patient is given ED precautions to return to the ED for any worsening or new symptoms.   TAYLYN BRAME was evaluated in Emergency Department on 05/06/2019 for the symptoms described in the history of present illness. She was evaluated in the context of the global COVID-19 pandemic, which necessitated consideration that the patient might be at risk for infection with the SARS-CoV-2 virus that causes COVID-19. Institutional protocols and algorithms that pertain to the evaluation of patients at risk for COVID-19 are in a state of rapid change based on information released by regulatory bodies including the CDC and federal and state organizations. These policies and algorithms were followed during the patient's care in the ED.  ____________________________________________  FINAL CLINICAL IMPRESSION(S) / ED DIAGNOSES  Final diagnoses:  Acute bilateral low back pain without sciatica  Lower urinary tract infectious disease      NEW MEDICATIONS STARTED DURING THIS VISIT:  ED Discharge Orders         Ordered    cephALEXin (KEFLEX) 500 MG capsule  2 times daily     05/06/19 2316    Baclofen 5 MG TABS  3 times daily PRN     05/06/19 2316    acetaminophen (TYLENOL) 500 MG tablet  Every 6 hours PRN     05/06/19 2317              This chart was  dictated using voice recognition software/Dragon. Despite best efforts to proofread, errors can occur which can change the meaning. Any change was purely unintentional.    Laban Emperor, PA-C 05/06/19 2331    Earleen Newport, MD 05/06/19 [phone]

## 2019-05-06 NOTE — ED Triage Notes (Signed)
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

## 2019-06-07 ENCOUNTER — Telehealth: Payer: Self-pay | Admitting: Family Medicine

## 2019-06-07 DIAGNOSIS — N760 Acute vaginitis: Secondary | ICD-10-CM

## 2019-06-07 MED ORDER — FLUCONAZOLE 150 MG PO TABS: ORAL_TABLET | ORAL | 0 refills | Status: DC

## 2019-06-07 NOTE — Telephone Encounter (Signed)
Pt called and stated she has a yeast infection and wanted to know if Dr. Sherrie Mustache can send in the same antibiotic that she was prescribed before /Pt doesn't know the name of the Rx/please advise

## 2019-06-07 NOTE — Telephone Encounter (Signed)
Patient C/O vaginal itching and discharge. Symptoms started 3-4 days ago. She tried Monistat with no relief. She is requesting a RX be sent to [facility]. Patient states she has been treated for yeast infection previously. Please review.

## 2019-06-10 ENCOUNTER — Telehealth: Payer: Self-pay

## 2019-06-10 DIAGNOSIS — N760 Acute vaginitis: Secondary | ICD-10-CM

## 2019-06-10 NOTE — Telephone Encounter (Signed)
Copied from CRM [id number]. Topic: General - Other >> Jun 10, 2019 10:07 AM Fanny Bien wrote: Reason for CRM: pt would like to know if Dr Sherrie Mustache could writer her an RX for crutches. Please advise

## 2019-06-14 ENCOUNTER — Encounter: Payer: Self-pay | Admitting: Family Medicine

## 2019-06-14 ENCOUNTER — Other Ambulatory Visit: Payer: Self-pay

## 2019-06-14 DIAGNOSIS — N760 Acute vaginitis: Secondary | ICD-10-CM

## 2019-06-14 MED ORDER — FLUCONAZOLE 150 MG PO TABS: ORAL_TABLET | ORAL | 0 refills | Status: AC

## 2019-06-14 NOTE — Telephone Encounter (Signed)
Prescription written

## 2019-06-14 NOTE — Telephone Encounter (Signed)
Patient called stating that someone in her home threw away the 2nd pill of Fluconazole that she was supposed to take. She wants to know if we can send in one pill to her pharmacy so that she can completed the prescription as it was prescribed.

## 2019-06-14 NOTE — Telephone Encounter (Signed)
Patient advised. Prescription placed up front for pick up.

## 2019-07-01 MED ORDER — FLUCONAZOLE 150 MG PO TABS: ORAL_TABLET | ORAL | 0 refills | Status: AC

## 2019-07-01 NOTE — Addendum Note (Signed)
Addended by: Malva Limes on: 07/01/2019 02:54 PM   Modules accepted: Orders

## 2019-07-01 NOTE — Telephone Encounter (Signed)
Patient called stating that she had lost her 2nd dose of Diflucan and would like to know if she could have another one?

## 2019-07-01 NOTE — Telephone Encounter (Signed)
Patient is calling to follow up on the below request. [place] would also like to know does she need both pills? Or just the second pill? Please advise Cb- [phone]

## 2019-11-18 ENCOUNTER — Encounter: Payer: Self-pay | Admitting: Adult Health

## 2019-11-18 ENCOUNTER — Telehealth (INDEPENDENT_AMBULATORY_CARE_PROVIDER_SITE_OTHER): Payer: Medicare Other | Admitting: Adult Health

## 2019-11-18 DIAGNOSIS — J069 Acute upper respiratory infection, unspecified: Secondary | ICD-10-CM

## 2019-11-18 NOTE — Progress Notes (Addendum)
MyChart Video Visit   Virtual Visit via Video Note  I connected with Desmond Lope on 11/22/19 at  9:00 AM EDT by a video enabled telemedicine application and verified that I am speaking with the correct person using two identifiers.  I discussed the limitations of evaluation and management by telemedicine and the availability of in person appointments. The patient expressed understanding and agreed to proceed.    Virtual Visit via Video Note   This visit type was conducted due to national recommendations for restrictions regarding the COVID-19 Pandemic (e.g. social distancing) in an effort to limit this patient's exposure and mitigate transmission in our community. This patient is at least at moderate risk for complications without adequate follow up. This format is felt to be most appropriate for this patient at this time. Physical exam was limited by quality of the video and audio technology used for the visit.   Patient location: at home  Provider location: Provider: Provider's office at  [facility], [place].      Patient: Casey Reese   DOB: 08/01/1990   29 y.o. Female  MRN: [id number] Visit Date: 11/18/2019  Today's healthcare provider: Marcille Buffy, FNP   Chief Complaint  Patient presents with  . URI   Subjective    HPI   Patient reports 3 day onset of sore throat, body aches, nasal drainage. Occasional cough.  Denies loss of taste or smell.   Denies any ill exposures.   Denies any other recent illness.  She has been taking Nyquil.  Patient  denies any fever, body aches,chills, rash, chest pain, shortness of breath, nausea, vomiting, or diarrhea. Denies dizziness, lightheadedness, pre syncopal or syncopal episodes.    HPI    URI    Associated symptoms inlclude cough, sinus pain, headache, jiont pain , sneezing, rhinorrhea, sore throat and wheezing.  Recent episode started in the past 7 days.  The problem has been  unchanged since onset.  The temperature has been with in normal range.  Past hisotry is significant for  asthma.       Last edited by Minette Headland, CMA on 11/18/2019  8:48 AM. (History)        Patient Active Problem List   Diagnosis Date Noted  . Viral upper respiratory tract infection 11/18/2019  . Menorrhagia with irregular cycle 07/01/2017  . Iron deficiency anemia 06/29/2017  . Anxiety 06/09/2017  . GERD (gastroesophageal reflux disease) 05/23/2016  . Helicobacter pylori gastritis 07/09/2015  . Hypertension associated with diabetes ([place]) 06/29/2015  . Diabetes mellitus ([place]) 06/04/2015  . Obesity, Class III, BMI 40-49.9 (morbid obesity) ([place]) 01/18/2008  . 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 Diabetes mellitus without complication ([place])   . History of chicken pox   . Hypertension   . S/P above knee amputation ([place])   . 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 Smoking status: Current Some Day Smoker    Types: Cigarettes  . Smokeless tobacco: Never Used  . Tobacco comment: 1 pack every 2 weeks. Started smoking at age 51  Substance Use Topics  . Alcohol use: Yes    Alcohol/week: 0.0 standard drinks    Comment: occasionally  . Drug use: No   Allergies  Allergen Reactions  . Sulfa Antibiotics     UNKNOWN      Medications: Outpatient Medications Prior  to Visit  Medication Sig  . acetaminophen (TYLENOL) 500 MG tablet Take 1 tablet (500 mg total) by mouth every 6 (six) hours as needed.  Marland Kitchen albuterol (PROVENTIL HFA) 108 (90 Base) MCG/ACT inhaler Inhale 2 puffs into the lungs every 6 (six) hours as needed.  . Baclofen 5 MG TABS Take 5 mg by mouth 3 (three) times daily as needed.  . blood glucose meter kit and supplies KIT Dispense based on patient and insurance preference to check sugar once daily. (FOR ICD-9 250.00)  . chlorhexidine (PERIDEX) 0.12 %  solution 15 mLs 2 (two) times daily.  . chlorthalidone (HYGROTON) 25 MG tablet Take 1 tablet (25 mg total) by mouth daily.  Marland Kitchen diltiazem (CARTIA XT) 120 MG 24 hr capsule TAKE 1 CAPSULE BY MOUTH EVERY DAY  . ferrous gluconate (FERGON) 324 MG tablet Take 1 tablet (324 mg total) by mouth daily.  . metFORMIN (GLUCOPHAGE-XR) 500 MG 24 hr tablet Take two tablets daily  . omeprazole (PRILOSEC) 40 MG capsule Take 1 capsule (40 mg total) by mouth daily.  . ondansetron (ZOFRAN ODT) 4 MG disintegrating tablet Take 1 tablet (4 mg total) by mouth every 8 (eight) hours as needed for nausea or vomiting.  . sucralfate (CARAFATE) 1 g tablet TAKE 1 TABLET(1 GRAM) BY MOUTH FOUR TIMES DAILY WITH MEALS AND AT BEDTIME   No facility-administered medications prior to visit.    Review of Systems  Constitutional: Negative.   HENT: Positive for congestion, postnasal drip and rhinorrhea.   Respiratory: Negative.   Cardiovascular: Negative.   Gastrointestinal: Negative.   Genitourinary: Negative.   Musculoskeletal: Negative.   Neurological: Negative.   Psychiatric/Behavioral: Negative.       Objective    There were no vitals taken for this visit.   Physical Exam   Patient is alert and oriented and responsive to questions Engages in conversation with provider. Speaks in full sentences without any pauses without any shortness of breath or distress.     Assessment & Plan    Discussed possible nasal allergies, recommend Covid testing.  Counter symptom management discussed and explicit strict return to office protocol was given. Nasal saline per package instructions.  Discussed over the counter medications for symptom relief.    Return in about 1 week (around 11/25/2019), or if symptoms worsen or fail to improve, for at any time for any worsening symptoms, Go to Emergency room/ urgent care if worse.     I discussed the assessment and treatment plan with the patient. The patient was provided an opportunity  to ask questions and all were answered. The patient agreed with the plan and demonstrated an understanding of the instructions.   The patient was advised to call back or seek an in-person evaluation if the symptoms worsen or if the condition fails to improve as anticipated.  I provided 20  minutes of non-face-to-face time during this encounter.  IWellington Hampshire Lelan Cush, FNP, have reviewed all documentation for this visit. The documentation on 11/18/19 for the exam, diagnosis, procedures, and orders are all accurate and complete.   Marcille Buffy, [place] [phone] (phone) [phone] (fax)  [place]

## 2019-11-18 NOTE — Patient Instructions (Signed)
Covid testing advised.   Upper Respiratory Infection, Adult An upper respiratory infection (URI) affects the nose, throat, and upper air passages. URIs are caused by germs (viruses). The most common type of URI is often called "the common cold." Medicines cannot cure URIs, but you can do things at home to relieve your symptoms. URIs usually get better within 7-10 days. Follow these instructions at home: Activity  Rest as needed.  If you have a fever, stay home from work or school until your fever is gone, or until your doctor says you may return to work or school. ? You should stay home until you cannot spread the infection anymore (you are not contagious). ? Your doctor may have you wear a face mask so you have less risk of spreading the infection. Relieving symptoms  Gargle with a salt-water mixture 3-4 times a day or as needed. To make a salt-water mixture, completely dissolve -1 tsp of salt in 1 cup of warm water.  Use a cool-mist humidifier to add moisture to the air. This can help you breathe more easily. Eating and drinking   Drink enough fluid to keep your pee (urine) pale yellow.  Eat soups and other clear broths. General instructions   Take over-the-counter and prescription medicines only as told by your doctor. These include cold medicines, fever reducers, and cough suppressants.  Do not use any products that contain nicotine or tobacco. These include cigarettes and e-cigarettes. If you need help quitting, ask your doctor.  Avoid being where people are smoking (avoid secondhand smoke).  Make sure you get regular shots and get the flu shot every year.  Keep all follow-up visits as told by your doctor. This is important. How to avoid spreading infection to others   Wash your hands often with soap and water. If you do not have soap and water, use hand sanitizer.  Avoid touching your mouth, face, eyes, or nose.  Cough or sneeze into a tissue or your sleeve or  elbow. Do not cough or sneeze into your hand or into the air. Contact a doctor if:  You are getting worse, not better.  You have any of these: ? A fever. ? Chills. ? Brown or red mucus in your nose. ? Yellow or brown fluid (discharge)coming from your nose. ? Pain in your face, especially when you bend forward. ? Swollen neck glands. ? Pain with swallowing. ? White areas in the back of your throat. Get help right away if:  You have shortness of breath that gets worse.  You have very bad or constant: ? Headache. ? Ear pain. ? Pain in your forehead, behind your eyes, and over your cheekbones (sinus pain). ? Chest pain.  You have long-lasting (chronic) lung disease along with any of these: ? Wheezing. ? Long-lasting cough. ? Coughing up blood. ? A change in your usual mucus.  You have a stiff neck.  You have changes in your: ? Vision. ? Hearing. ? Thinking. ? Mood. Summary  An upper respiratory infection (URI) is caused by a germ called a virus. The most common type of URI is often called "the common cold."  URIs usually get better within 7-10 days.  Take over-the-counter and prescription medicines only as told by your doctor. This information is not intended to replace advice given to you by your health care provider. Make sure you discuss any questions you have with your health care provider. Document Revised: 04/22/2018 Document Reviewed: 12/05/2016 Elsevier Patient Education  2020 Elsevier  Inc. ° °

## 2020-01-23 ENCOUNTER — Telehealth: Payer: Self-pay | Admitting: Family Medicine

## 2020-01-23 NOTE — Telephone Encounter (Signed)
Copied from CRM [id number]. Topic: Medicare AWV >> Jan 23, 2020 12:54 PM Claudette Laws R wrote: Reason for CRM:  Left message for patient to call back and schedule Medicare Annual Wellness Visit (AWV) either virtually or in office.  Last AWV  07/09/2015  Please schedule at anytime with [facility] Health Advisor.  If any questions, please contact me at [phone]

## 2020-02-17 ENCOUNTER — Ambulatory Visit: Payer: Medicare Other | Admitting: Family Medicine

## 2020-02-17 NOTE — Progress Notes (Deleted)
Established patient visit   Patient: Casey Reese   DOB: May 20, 1990   29 y.o. Female  MRN: [id number] Visit Date: 02/17/2020  Today's healthcare provider: Lelon Huh, MD   No chief complaint on file.  Subjective    HPI  Diabetes Mellitus Type II, Follow-up  Lab Results  Component Value Date   HGBA1C 7.3 (A) 08/31/2018   HGBA1C 6.9 06/03/2017   HGBA1C 7.5 03/06/2017   Wt Readings from Last 3 Encounters:  05/06/19 242 lb (109.8 kg)  08/31/18 264 lb (119.7 kg)  06/09/18 278 lb 12.8 oz (126.5 kg)   Last seen for diabetes 08/31/2018.   Management since then includes continue same medication. She reports {excellent/good/fair/poor:19665} compliance with treatment. She {is/is not:21021397} having side effects. {document side effects if present:1} Symptoms: {Yes/No:20286} fatigue {Yes/No:20286} foot ulcerations  {Yes/No:20286} appetite changes {Yes/No:20286} nausea  {Yes/No:20286} paresthesia of the feet  {Yes/No:20286} polydipsia  {Yes/No:20286} polyuria {Yes/No:20286} visual disturbances   {Yes/No:20286} vomiting     Home blood sugar records: {diabetes glucometry results:16657}  Episodes of hypoglycemia? {Yes/No:20286} {enter symptoms and frequency of symptoms if yes:1}   Current insulin regiment: none Most Recent Eye Exam: not UTD {Current exercise:16438:::1} {Current diet habits:16563:::1}  Pertinent Labs: Lab Results  Component Value Date   CHOL 134 07/09/2015   HDL 46 07/09/2015   LDLCALC 78 07/09/2015   TRIG 50 07/09/2015   CHOLHDL 2.9 07/09/2015   Lab Results  Component Value Date   NA 139 06/04/2017   K 4.0 06/04/2017   CREATININE 0.66 06/04/2017   GFRNONAA 121 06/04/2017   GFRAA 139 06/04/2017   GLUCOSE 130 (H) 06/04/2017     ---------------------------------------------------------------------------------------------------  {Show patient history (optional):23778::" "}   Medications: Outpatient Medications Prior to Visit    Medication Sig  . acetaminophen (TYLENOL) 500 MG tablet Take 1 tablet (500 mg total) by mouth every 6 (six) hours as needed.  Marland Kitchen albuterol (PROVENTIL HFA) 108 (90 Base) MCG/ACT inhaler Inhale 2 puffs into the lungs every 6 (six) hours as needed.  . Baclofen 5 MG TABS Take 5 mg by mouth 3 (three) times daily as needed.  . blood glucose meter kit and supplies KIT Dispense based on patient and insurance preference to check sugar once daily. (FOR ICD-9 250.00)  . chlorhexidine (PERIDEX) 0.12 % solution 15 mLs 2 (two) times daily.  . chlorthalidone (HYGROTON) 25 MG tablet Take 1 tablet (25 mg total) by mouth daily.  Marland Kitchen diltiazem (CARTIA XT) 120 MG 24 hr capsule TAKE 1 CAPSULE BY MOUTH EVERY DAY  . ferrous gluconate (FERGON) 324 MG tablet Take 1 tablet (324 mg total) by mouth daily.  . metFORMIN (GLUCOPHAGE-XR) 500 MG 24 hr tablet Take two tablets daily  . omeprazole (PRILOSEC) 40 MG capsule Take 1 capsule (40 mg total) by mouth daily.  . ondansetron (ZOFRAN ODT) 4 MG disintegrating tablet Take 1 tablet (4 mg total) by mouth every 8 (eight) hours as needed for nausea or vomiting.  . sucralfate (CARAFATE) 1 g tablet TAKE 1 TABLET(1 GRAM) BY MOUTH FOUR TIMES DAILY WITH MEALS AND AT BEDTIME   No facility-administered medications prior to visit.    Review of Systems  {Heme  Chem  Endocrine  Serology  Results Review (optional):23779::" "}  Objective    There were no vitals taken for this visit. {Show previous vital signs (optional):23777::" "}  Physical Exam  ***  No results found for any visits on 02/17/20.  Assessment & Plan     ***  No follow-ups on file.      {provider attestation***:1}   Donald Fisher, MD  [facility] [phone] (phone) [phone] (fax)  [facility] 

## 2020-03-30 ENCOUNTER — Other Ambulatory Visit: Payer: Self-pay

## 2020-03-30 ENCOUNTER — Encounter: Payer: Self-pay | Admitting: Physician Assistant

## 2020-03-30 ENCOUNTER — Ambulatory Visit (INDEPENDENT_AMBULATORY_CARE_PROVIDER_SITE_OTHER): Payer: Medicare Other | Admitting: Physician Assistant

## 2020-03-30 VITALS — BP 136/95 | HR 97 | Temp 98.4°F | Wt 268.3 lb

## 2020-03-30 DIAGNOSIS — K0889 Other specified disorders of teeth and supporting structures: Secondary | ICD-10-CM

## 2020-03-30 MED ORDER — AMOXICILLIN-POT CLAVULANATE 875-125 MG PO TABS: 1.0000 | ORAL_TABLET | Freq: Two times a day (BID) | ORAL | 0 refills | Status: AC

## 2020-03-30 MED ORDER — IBUPROFEN 600 MG PO TABS: 600.0000 mg | ORAL_TABLET | Freq: Three times a day (TID) | ORAL | 0 refills | Status: AC | PRN

## 2020-03-30 NOTE — Patient Instructions (Signed)
Health Maintenance, Female Adopting a healthy lifestyle and getting preventive care are important in promoting health and wellness. Ask your health care provider about:  The right schedule for you to have regular tests and exams.  Things you can do on your own to prevent diseases and keep yourself healthy. What should I know about diet, weight, and exercise? Eat a healthy diet   Eat a diet that includes plenty of vegetables, fruits, low-fat dairy products, and lean protein.  Do not eat a lot of foods that are high in solid fats, added sugars, or sodium. Maintain a healthy weight Body mass index (BMI) is used to identify weight problems. It estimates body fat based on height and weight. Your health care provider can help determine your BMI and help you achieve or maintain a healthy weight. Get regular exercise Get regular exercise. This is one of the most important things you can do for your health. Most adults should:  Exercise for at least 150 minutes each week. The exercise should increase your heart rate and make you sweat (moderate-intensity exercise).  Do strengthening exercises at least twice a week. This is in addition to the moderate-intensity exercise.  Spend less time sitting. Even light physical activity can be beneficial. Watch cholesterol and blood lipids Have your blood tested for lipids and cholesterol at 31 years of age, then have this test every 5 years. Have your cholesterol levels checked more often if:  Your lipid or cholesterol levels are high.  You are older than 31 years of age.  You are at high risk for heart disease. What should I know about cancer screening? Depending on your health history and family history, you may need to have cancer screening at various ages. This may include screening for:  Breast cancer.  Cervical cancer.  Colorectal cancer.  Skin cancer.  Lung cancer. What should I know about heart disease, diabetes, and high blood  pressure? Blood pressure and heart disease  High blood pressure causes heart disease and increases the risk of stroke. This is more likely to develop in people who have high blood pressure readings, are of African descent, or are overweight.  Have your blood pressure checked: ? Every 3-5 years if you are 18-39 years of age. ? Every year if you are 40 years old or older. Diabetes Have regular diabetes screenings. This checks your fasting blood sugar level. Have the screening done:  Once every three years after age 40 if you are at a normal weight and have a low risk for diabetes.  More often and at a younger age if you are overweight or have a high risk for diabetes. What should I know about preventing infection? Hepatitis B If you have a higher risk for hepatitis B, you should be screened for this virus. Talk with your health care provider to find out if you are at risk for hepatitis B infection. Hepatitis C Testing is recommended for:  Everyone born from 1945 through 1965.  Anyone with known risk factors for hepatitis C. Sexually transmitted infections (STIs)  Get screened for STIs, including gonorrhea and chlamydia, if: ? You are sexually active and are younger than 31 years of age. ? You are older than 31 years of age and your health care provider tells you that you are at risk for this type of infection. ? Your sexual activity has changed since you were last screened, and you are at increased risk for chlamydia or gonorrhea. Ask your health care provider if   you are at risk.  Ask your health care provider about whether you are at high risk for HIV. Your health care provider may recommend a prescription medicine to help prevent HIV infection. If you choose to take medicine to prevent HIV, you should first get tested for HIV. You should then be tested every 3 months for as long as you are taking the medicine. Pregnancy  If you are about to stop having your period (premenopausal) and  you may become pregnant, seek counseling before you get pregnant.  Take 400 to 800 micrograms (mcg) of folic acid every day if you become pregnant.  Ask for birth control (contraception) if you want to prevent pregnancy. Osteoporosis and menopause Osteoporosis is a disease in which the bones lose minerals and strength with aging. This can result in bone fractures. If you are 65 years old or older, or if you are at risk for osteoporosis and fractures, ask your health care provider if you should:  Be screened for bone loss.  Take a calcium or vitamin D supplement to lower your risk of fractures.  Be given hormone replacement therapy (HRT) to treat symptoms of menopause. Follow these instructions at home: Lifestyle  Do not use any products that contain nicotine or tobacco, such as cigarettes, e-cigarettes, and chewing tobacco. If you need help quitting, ask your health care provider.  Do not use street drugs.  Do not share needles.  Ask your health care provider for help if you need support or information about quitting drugs. Alcohol use  Do not drink alcohol if: ? Your health care provider tells you not to drink. ? You are pregnant, may be pregnant, or are planning to become pregnant.  If you drink alcohol: ? Limit how much you use to 0-1 drink a day. ? Limit intake if you are breastfeeding.  Be aware of how much alcohol is in your drink. In the [place] one drink equals one 12 oz bottle of beer (355 mL), one 5 oz glass of wine (148 mL), or one 1 oz glass of hard liquor (44 mL). General instructions  Schedule regular health, dental, and eye exams.  Stay current with your vaccines.  Tell your health care provider if: ? You often feel depressed. ? You have ever been abused or do not feel safe at home. Summary  Adopting a healthy lifestyle and getting preventive care are important in promoting health and wellness.  Follow your health care provider's instructions about healthy  diet, exercising, and getting tested or screened for diseases.  Follow your health care provider's instructions on monitoring your cholesterol and blood pressure. This information is not intended to replace advice given to you by your health care provider. Make sure you discuss any questions you have with your health care provider. Document Revised: 04/07/2018 Document Reviewed: 04/07/2018 Elsevier Patient Education  2020 Elsevier Inc.  

## 2020-03-30 NOTE — Progress Notes (Signed)
Established patient visit   Patient: Casey Reese   DOB: 19-Mar-1991   29 y.o. Female  MRN: [id number] Visit Date: 03/30/2020  Today's healthcare provider: Trinna Post, PA-C   Chief Complaint  Patient presents with  . Dental Pain  I,Porsha C McClurkin,acting as a scribe for Trinna Post, PA-C.,have documented all relevant documentation on the behalf of Trinna Post, PA-C,as directed by  Trinna Post, PA-C while in the presence of Trinna Post, PA-C.  Subjective    Dental Pain  This is a new problem. The current episode started yesterday. The problem occurs constantly. The problem has been unchanged. The pain is at a severity of 10/10. The pain is severe. Associated symptoms include facial pain, oral bleeding and thermal sensitivity. Pertinent negatives include no difficulty swallowing, fever or sinus pressure. She has tried acetaminophen for the symptoms. The treatment provided no relief.    Reports her dentist is closed today. Denies fevers, chills, nausea, vomiting.      Medications: Outpatient Medications Prior to Visit  Medication Sig  . acetaminophen (TYLENOL) 500 MG tablet Take 1 tablet (500 mg total) by mouth every 6 (six) hours as needed.  Marland Kitchen albuterol (PROVENTIL HFA) 108 (90 Base) MCG/ACT inhaler Inhale 2 puffs into the lungs every 6 (six) hours as needed.  . Baclofen 5 MG TABS Take 5 mg by mouth 3 (three) times daily as needed.  . blood glucose meter kit and supplies KIT Dispense based on patient and insurance preference to check sugar once daily. (FOR ICD-9 250.00)  . chlorhexidine (PERIDEX) 0.12 % solution 15 mLs 2 (two) times daily.  . chlorthalidone (HYGROTON) 25 MG tablet Take 1 tablet (25 mg total) by mouth daily.  Marland Kitchen diltiazem (CARTIA XT) 120 MG 24 hr capsule TAKE 1 CAPSULE BY MOUTH EVERY DAY  . ferrous gluconate (FERGON) 324 MG tablet Take 1 tablet (324 mg total) by mouth daily.  . metFORMIN (GLUCOPHAGE-XR) 500 MG 24 hr tablet Take two  tablets daily  . omeprazole (PRILOSEC) 40 MG capsule Take 1 capsule (40 mg total) by mouth daily.  . ondansetron (ZOFRAN ODT) 4 MG disintegrating tablet Take 1 tablet (4 mg total) by mouth every 8 (eight) hours as needed for nausea or vomiting.  . sucralfate (CARAFATE) 1 g tablet TAKE 1 TABLET(1 GRAM) BY MOUTH FOUR TIMES DAILY WITH MEALS AND AT BEDTIME   No facility-administered medications prior to visit.    Review of Systems  Constitutional: Negative for fever.  HENT: Negative for sinus pressure.       Objective    BP (!) 136/95 (BP Location: Right Arm, Patient Position: Sitting, Cuff Size: Large)   Pulse 97   Temp 98.4 F (36.9 C) (Oral)   Wt 268 lb 4.8 oz (121.7 kg)   LMP 03/08/2020 (Exact Date)   SpO2 99%   BMI 43.30 kg/m    Physical Exam Constitutional:      Appearance: Normal appearance. She is obese.  HENT:     Mouth/Throat:     Mouth: No injury or oral lesions.     Dentition: Dental caries present. No dental tenderness or dental abscesses.  Skin:    General: Skin is warm and dry.  Neurological:     General: No focal deficit present.     Mental Status: She is alert and oriented to person, place, and time.  Psychiatric:        Mood and Affect: Mood normal.  Behavior: Behavior normal.       No results found for any visits on 03/30/20.  Assessment & Plan    1. Pain, dental  Do not see explicit abscess however patient has poor dentition. Will cover for infection as below, f/u with dentist.   - amoxicillin-clavulanate (AUGMENTIN) 875-125 MG tablet; Take 1 tablet by mouth 2 (two) times daily for 7 days.  Dispense: 14 tablet; Refill: 0 - ibuprofen (ADVIL) 600 MG tablet; Take 1 tablet (600 mg total) by mouth every 8 (eight) hours as needed.  Dispense: 30 tablet; Refill: 0   No follow-ups on file.      I, Adriana M Pollak, PA-C, have reviewed all documentation for this visit. The documentation on 04/02/20 for the exam, diagnosis, procedures, and orders  are all accurate and complete.  The entirety of the information documented in the History of Present Illness, Review of Systems and Physical Exam were personally obtained by me. Portions of this information were initially documented by Porsha McClurkin and reviewed by me for thoroughness and accuracy.     Adriana M Pollak, PA-C  [facility] [phone] (phone) [phone] (fax)  [facility]  

## 2020-05-03 ENCOUNTER — Other Ambulatory Visit: Payer: Self-pay | Admitting: Family Medicine

## 2020-05-03 DIAGNOSIS — R059 Cough, unspecified: Secondary | ICD-10-CM

## 2020-05-03 NOTE — Telephone Encounter (Signed)
Copied from CRM [id number]. Topic: Quick Communication - Rx Refill/Question >> May 03, 2020  3:26 PM Jaquita Rector A wrote: Medication: albuterol (PROVENTIL HFA) 108 (90 Base) MCG/ACT inhaler  Has the patient contacted their pharmacy? Yes.   (Agent: If no, request that the patient contact the pharmacy for the refill.) (Agent: If yes, when and what did the pharmacy advise?)  Preferred Pharmacy (with phone number or street name): [id number] [place], [place] - [address] AT [facility] OF [address]  Phone:  [phone] Fax:  [phone]     Agent: Please be advised that RX refills may take up to 3 business days. We ask that you follow-up with your pharmacy.

## 2020-05-04 ENCOUNTER — Other Ambulatory Visit: Payer: Self-pay | Admitting: Family Medicine

## 2020-05-04 DIAGNOSIS — R059 Cough, unspecified: Secondary | ICD-10-CM

## 2020-05-04 MED ORDER — ALBUTEROL SULFATE HFA 108 (90 BASE) MCG/ACT IN AERS: 2.0000 | INHALATION_SPRAY | Freq: Four times a day (QID) | RESPIRATORY_TRACT | 1 refills | Status: DC | PRN

## 2020-05-04 NOTE — Telephone Encounter (Signed)
   Requested medications are on the active medication list yes   Last visit do not see mentioned in an OV in past 12 months  Future visit scheduled no  Notes to clinic Failed protocol due to no valid visit within 12  months.No upcoming appt scheduled.

## 2020-05-17 ENCOUNTER — Telehealth: Payer: Self-pay | Admitting: Family Medicine

## 2020-05-17 NOTE — Telephone Encounter (Signed)
Copied from CRM [id number]. Topic: Medicare AWV >> May 17, 2020  3:09 PM Claudette Laws R wrote: Reason for CRM:  No answer unable to leave a message for patient to call back and schedule Medicare Annual Wellness Visit (AWV) in office.   If not able to come in office, please offer to do virtually or by telephone.   Last AWV 07/09/2015  Please schedule at anytime with [facility] Health Advisor.  If any questions, please contact me at [phone]

## 2020-05-21 DIAGNOSIS — Z20822 Contact with and (suspected) exposure to covid-19: Secondary | ICD-10-CM | POA: Diagnosis not present

## 2020-05-21 DIAGNOSIS — Z03818 Encounter for observation for suspected exposure to other biological agents ruled out: Secondary | ICD-10-CM | POA: Diagnosis not present

## 2020-05-22 ENCOUNTER — Telehealth (INDEPENDENT_AMBULATORY_CARE_PROVIDER_SITE_OTHER): Payer: Medicare Other | Admitting: Family Medicine

## 2020-05-22 ENCOUNTER — Ambulatory Visit: Payer: Medicare Other | Admitting: Family Medicine

## 2020-05-22 ENCOUNTER — Encounter: Payer: Self-pay | Admitting: Family Medicine

## 2020-05-22 DIAGNOSIS — R059 Cough, unspecified: Secondary | ICD-10-CM | POA: Diagnosis not present

## 2020-05-22 DIAGNOSIS — E119 Type 2 diabetes mellitus without complications: Secondary | ICD-10-CM | POA: Diagnosis not present

## 2020-05-22 DIAGNOSIS — U071 COVID-19: Secondary | ICD-10-CM

## 2020-05-22 MED ORDER — ONETOUCH ULTRA 2 W/DEVICE KIT: PACK | 0 refills | Status: AC

## 2020-05-22 MED ORDER — ALBUTEROL SULFATE HFA 108 (90 BASE) MCG/ACT IN AERS: 2.0000 | INHALATION_SPRAY | Freq: Four times a day (QID) | RESPIRATORY_TRACT | 1 refills | Status: DC | PRN

## 2020-05-22 MED ORDER — ONETOUCH ULTRASOFT LANCETS MISC: 4 refills | Status: AC

## 2020-05-22 MED ORDER — PREDNISONE 20 MG PO TABS: 20.0000 mg | ORAL_TABLET | Freq: Two times a day (BID) | ORAL | 0 refills | Status: AC

## 2020-05-22 MED ORDER — ONETOUCH ULTRA VI STRP: ORAL_STRIP | 4 refills | Status: AC

## 2020-05-22 NOTE — Progress Notes (Deleted)
Established patient visit   Patient: Casey Reese   DOB: 01-Oct-1990   30 y.o. Female  MRN: [id number] Visit Date: 05/22/2020  Today's healthcare provider: Lelon Huh, MD   No chief complaint on file.  Subjective    HPI  Diabetes Mellitus Type II, Follow-up  Lab Results  Component Value Date   HGBA1C 7.3 (A) 08/31/2018   HGBA1C 6.9 06/03/2017   HGBA1C 7.5 03/06/2017   Wt Readings from Last 3 Encounters:  03/30/20 268 lb 4.8 oz (121.7 kg)  05/06/19 242 lb (109.8 kg)  08/31/18 264 lb (119.7 kg)   Last seen for diabetes 08/31/2018.   Management since then includes continue same medications. She reports {excellent/good/fair/poor:19665} compliance with treatment. She {is/is not:21021397} having side effects. {document side effects if present:1} Symptoms: {Yes/No:20286} fatigue {Yes/No:20286} foot ulcerations  {Yes/No:20286} appetite changes {Yes/No:20286} nausea  {Yes/No:20286} paresthesia of the feet  {Yes/No:20286} polydipsia  {Yes/No:20286} polyuria {Yes/No:20286} visual disturbances   {Yes/No:20286} vomiting     Home blood sugar records: {diabetes glucometry results:16657}  Episodes of hypoglycemia? {Yes/No:20286} {enter symptoms and frequency of symptoms if yes:1}   Current insulin regiment: none Most Recent Eye Exam: >1 year ago {Current exercise:16438:::1} {Current diet habits:16563:::1}  Pertinent Labs: Lab Results  Component Value Date   CHOL 134 07/09/2015   HDL 46 07/09/2015   LDLCALC 78 07/09/2015   TRIG 50 07/09/2015   CHOLHDL 2.9 07/09/2015   Lab Results  Component Value Date   NA 139 06/04/2017   K 4.0 06/04/2017   CREATININE 0.66 06/04/2017   GFRNONAA 121 06/04/2017   GFRAA 139 06/04/2017   GLUCOSE 130 (H) 06/04/2017     ---------------------------------------------------------------------------------------------------  Follow up ER visit  Patient was seen in ER for back pain on 05/05/2020. She was treated for UIT.       Patient was given prescription for Keflex for infection and baclofen and Tylenol for back pain She reports {DESC; EXCELLENT/GOOD/FAIR:19992} compliance with treatment. She reports this condition is {improved/worse/unchanged:3041574}.  -----------------------------------------------------------------------------------------   {Show patient history (optional):23778::" "}   Medications: Outpatient Medications Prior to Visit  Medication Sig  . acetaminophen (TYLENOL) 500 MG tablet Take 1 tablet (500 mg total) by mouth every 6 (six) hours as needed.  Marland Kitchen albuterol (PROVENTIL HFA) 108 (90 Base) MCG/ACT inhaler Inhale 2 puffs into the lungs every 6 (six) hours as needed.  . Baclofen 5 MG TABS Take 5 mg by mouth 3 (three) times daily as needed.  . blood glucose meter kit and supplies KIT Dispense based on patient and insurance preference to check sugar once daily. (FOR ICD-9 250.00)  . chlorhexidine (PERIDEX) 0.12 % solution 15 mLs 2 (two) times daily.  . chlorthalidone (HYGROTON) 25 MG tablet Take 1 tablet (25 mg total) by mouth daily.  Marland Kitchen diltiazem (CARTIA XT) 120 MG 24 hr capsule TAKE 1 CAPSULE BY MOUTH EVERY DAY  . ferrous gluconate (FERGON) 324 MG tablet Take 1 tablet (324 mg total) by mouth daily.  Marland Kitchen ibuprofen (ADVIL) 600 MG tablet Take 1 tablet (600 mg total) by mouth every 8 (eight) hours as needed.  . metFORMIN (GLUCOPHAGE-XR) 500 MG 24 hr tablet Take two tablets daily  . omeprazole (PRILOSEC) 40 MG capsule Take 1 capsule (40 mg total) by mouth daily.  . ondansetron (ZOFRAN ODT) 4 MG disintegrating tablet Take 1 tablet (4 mg total) by mouth every 8 (eight) hours as needed for nausea or vomiting.  . sucralfate (CARAFATE) 1 g tablet TAKE 1 TABLET(1 GRAM) BY  MOUTH FOUR TIMES DAILY WITH MEALS AND AT BEDTIME   No facility-administered medications prior to visit.    Review of Systems  {Labs  Heme  Chem  Endocrine  Serology  Results Review (optional):23779::" "}   Objective    There  were no vitals taken for this visit. {Show previous vital signs (optional):23777::" "}   Physical Exam  ***  No results found for any visits on 05/22/20.  Assessment & Plan     ***  No follow-ups on file.      {provider attestation***:1}   Lelon Huh, MD  [facility] [phone] (phone) [phone] (fax)  [place]

## 2020-05-22 NOTE — Progress Notes (Signed)
MyChart Video Visit    Virtual Visit via Video Note   This visit type was conducted due to national recommendations for restrictions regarding the COVID-19 Pandemic (e.g. social distancing) in an effort to limit this patient's exposure and mitigate transmission in our community. This patient is at least at moderate risk for complications without adequate follow up. This format is felt to be most appropriate for this patient at this time. Physical exam was limited by quality of the video and audio technology used for the visit.   Patient location: home Provider location: [facility]  I discussed the limitations of evaluation and management by telemedicine and the availability of in person appointments. The patient expressed understanding and agreed to proceed.  Patient: Casey Reese   DOB: 03-Oct-1990   30 y.o. Female  MRN: [id number] Visit Date: 05/22/2020  Today's healthcare provider: Lelon Huh, MD   Chief Complaint  Patient presents with  . Covid Positive  . Cough   Subjective    Cough This is a new problem. Episode onset: 3 days ago. The problem has been unchanged. Associated symptoms include myalgias and shortness of breath. Pertinent negatives include no chest pain, chills or fever. She has tried nothing for the symptoms.  Cough is mild. Had stuffy nose and fatigue about 5 days. Occasionally short of breath, but not frequent or persistent. Oximetry running around 97 or 98%. Patient reports she tested positive for COVID yesterday through [employer].  Has had not Covid vaccines and no know prior Covid infections.    Medications: Outpatient Medications Prior to Visit  Medication Sig  . acetaminophen (TYLENOL) 500 MG tablet Take 1 tablet (500 mg total) by mouth every 6 (six) hours as needed.  Marland Kitchen albuterol (PROVENTIL HFA) 108 (90 Base) MCG/ACT inhaler Inhale 2 puffs into the lungs every 6 (six) hours as needed.  . Baclofen 5 MG TABS Take 5 mg by mouth 3 (three) times  daily as needed.  . blood glucose meter kit and supplies KIT Dispense based on patient and insurance preference to check sugar once daily. (FOR ICD-9 250.00)  . chlorhexidine (PERIDEX) 0.12 % solution 15 mLs 2 (two) times daily.  . chlorthalidone (HYGROTON) 25 MG tablet Take 1 tablet (25 mg total) by mouth daily.  Marland Kitchen diltiazem (CARTIA XT) 120 MG 24 hr capsule TAKE 1 CAPSULE BY MOUTH EVERY DAY  . ferrous gluconate (FERGON) 324 MG tablet Take 1 tablet (324 mg total) by mouth daily.  Marland Kitchen ibuprofen (ADVIL) 600 MG tablet Take 1 tablet (600 mg total) by mouth every 8 (eight) hours as needed.  . metFORMIN (GLUCOPHAGE-XR) 500 MG 24 hr tablet Take two tablets daily  . omeprazole (PRILOSEC) 40 MG capsule Take 1 capsule (40 mg total) by mouth daily.  . ondansetron (ZOFRAN ODT) 4 MG disintegrating tablet Take 1 tablet (4 mg total) by mouth every 8 (eight) hours as needed for nausea or vomiting.  . sucralfate (CARAFATE) 1 g tablet TAKE 1 TABLET(1 GRAM) BY MOUTH FOUR TIMES DAILY WITH MEALS AND AT BEDTIME   No facility-administered medications prior to visit.    Review of Systems  Constitutional: Negative for appetite change, chills, fatigue and fever.  HENT: Positive for congestion (nasal congestion).   Respiratory: Positive for cough and shortness of breath. Negative for chest tightness.   Cardiovascular: Negative for chest pain and palpitations.  Gastrointestinal: Negative for abdominal pain, nausea and vomiting.  Musculoskeletal: Positive for myalgias.  Neurological: Negative for dizziness and weakness.  Objective    There were no vitals taken for this visit.   Physical Exam   Awake, alert, oriented x 3. In no apparent distress   Assessment & Plan     1. Cough refill- albuterol (PROVENTIL HFA) 108 (90 Base) MCG/ACT inhaler; Inhale 2 puffs into the lungs every 6 (six) hours as needed.  Dispense: 18 g; Refill: 1  2. COVID-19  - albuterol (PROVENTIL HFA) 108 (90 Base) MCG/ACT inhaler;  Inhale 2 puffs into the lungs every 6 (six) hours as needed.  Dispense: 18 g; Refill: 1 - predniSONE (DELTASONE) 20 MG tablet; Take 1 tablet (20 mg total) by mouth 2 (two) times daily with a meal for 5 days.  Dispense: 10 tablet; Refill: 0  Symptoms are mild, but she has no known immunity from vaccine or prior infections. Advised to carefully monitor oximetry and call if any worsening or new sx or if oximetry drops below 90%  3. Type 2 diabetes mellitus without complication, without long-term current use of insulin (HCC)  - Blood Glucose Monitoring Suppl (ONE TOUCH ULTRA 2) w/Device KIT; Use to check sugar daily for type 2 diabetes E11.9  Dispense: 1 kit; Refill: 0 - glucose blood (ONETOUCH ULTRA) test strip; Use to check blood sugar daily for type 2 diabetes E11.9  Dispense: 100 strip; Refill: 4 - Lancets (ONETOUCH ULTRASOFT) lancets; Use to check blood sugar daily for type 2 diabetes E11.9  Dispense: 100 each; Refill: 4   Reschedule diabetes follow up out about 2 weeks, due to Covid infection.  No follow-ups on file.     I discussed the assessment and treatment plan with the patient. The patient was provided an opportunity to ask questions and all were answered. The patient agreed with the plan and demonstrated an understanding of the instructions.   The patient was advised to call back or seek an in-person evaluation if the symptoms worsen or if the condition fails to improve as anticipated.  I provided 12 minutes of non-face-to-face time during this encounter.  The entirety of the information documented in the History of Present Illness, Review of Systems and Physical Exam were personally obtained by me. Portions of this information were initially documented by the CMA and reviewed by me for thoroughness and accuracy.     Lelon Huh, MD [facility] [phone] (phone) [phone] (fax)  [place]

## 2020-05-24 DIAGNOSIS — K219 Gastro-esophageal reflux disease without esophagitis: Secondary | ICD-10-CM | POA: Diagnosis not present

## 2020-05-24 DIAGNOSIS — D500 Iron deficiency anemia secondary to blood loss (chronic): Secondary | ICD-10-CM | POA: Diagnosis not present

## 2020-05-24 DIAGNOSIS — I152 Hypertension secondary to endocrine disorders: Secondary | ICD-10-CM | POA: Diagnosis not present

## 2020-05-24 DIAGNOSIS — U071 COVID-19: Secondary | ICD-10-CM | POA: Diagnosis not present

## 2020-05-24 DIAGNOSIS — E1159 Type 2 diabetes mellitus with other circulatory complications: Secondary | ICD-10-CM | POA: Diagnosis not present

## 2020-05-24 DIAGNOSIS — N921 Excessive and frequent menstruation with irregular cycle: Secondary | ICD-10-CM | POA: Diagnosis not present

## 2020-06-06 ENCOUNTER — Telehealth: Payer: Self-pay

## 2020-06-06 NOTE — Telephone Encounter (Signed)
Copied from CRM [id number]. Topic: General - Other >> Jun 06, 2020  1:59 PM Casey Reese A wrote: Reason for CRM: Patient has returned missed call from practice There were no relevant notes or open encounters at the time of call  Patient would like to be contacted again when possible

## 2020-06-11 ENCOUNTER — Other Ambulatory Visit: Payer: Self-pay

## 2020-06-11 ENCOUNTER — Encounter: Payer: Self-pay | Admitting: Emergency Medicine

## 2020-06-11 ENCOUNTER — Emergency Department
Admission: EM | Admit: 2020-06-11 | Discharge: 2020-06-12 | Disposition: A | Discharge status: Home / Self Care | Payer: Medicare Other | Attending: Emergency Medicine | Admitting: Emergency Medicine

## 2020-06-11 ENCOUNTER — Ambulatory Visit: Payer: Self-pay | Admitting: Family Medicine

## 2020-06-11 DIAGNOSIS — R197 Diarrhea, unspecified: Secondary | ICD-10-CM | POA: Diagnosis not present

## 2020-06-11 DIAGNOSIS — I10 Essential (primary) hypertension: Secondary | ICD-10-CM | POA: Diagnosis not present

## 2020-06-11 DIAGNOSIS — E1165 Type 2 diabetes mellitus with hyperglycemia: Secondary | ICD-10-CM | POA: Diagnosis not present

## 2020-06-11 DIAGNOSIS — Z79899 Other long term (current) drug therapy: Secondary | ICD-10-CM | POA: Diagnosis not present

## 2020-06-11 DIAGNOSIS — F17210 Nicotine dependence, cigarettes, uncomplicated: Secondary | ICD-10-CM | POA: Diagnosis not present

## 2020-06-11 DIAGNOSIS — Z7984 Long term (current) use of oral hypoglycemic drugs: Secondary | ICD-10-CM | POA: Diagnosis not present

## 2020-06-11 DIAGNOSIS — R109 Unspecified abdominal pain: Secondary | ICD-10-CM | POA: Diagnosis not present

## 2020-06-11 DIAGNOSIS — R509 Fever, unspecified: Secondary | ICD-10-CM | POA: Diagnosis not present

## 2020-06-11 DIAGNOSIS — R531 Weakness: Secondary | ICD-10-CM | POA: Diagnosis not present

## 2020-06-11 DIAGNOSIS — Z8616 Personal history of COVID-19: Secondary | ICD-10-CM | POA: Insufficient documentation

## 2020-06-11 DIAGNOSIS — R000 Tachycardia, unspecified: Secondary | ICD-10-CM | POA: Insufficient documentation

## 2020-06-11 DIAGNOSIS — D649 Anemia, unspecified: Secondary | ICD-10-CM | POA: Diagnosis not present

## 2020-06-11 DIAGNOSIS — Z20822 Contact with and (suspected) exposure to covid-19: Secondary | ICD-10-CM | POA: Insufficient documentation

## 2020-06-11 DIAGNOSIS — R739 Hyperglycemia, unspecified: Secondary | ICD-10-CM

## 2020-06-11 DIAGNOSIS — E119 Type 2 diabetes mellitus without complications: Secondary | ICD-10-CM

## 2020-06-11 DIAGNOSIS — K760 Fatty (change of) liver, not elsewhere classified: Secondary | ICD-10-CM | POA: Diagnosis not present

## 2020-06-11 LAB — CBC
HCT: 37.5 % (ref 36.0–46.0)
Hemoglobin: 12.1 g/dL (ref 12.0–15.0)
MCH: 24.6 pg — ABNORMAL LOW (ref 26.0–34.0)
MCHC: 32.3 g/dL (ref 30.0–36.0)
MCV: 76.2 fL — ABNORMAL LOW (ref 80.0–100.0)
Platelets: 336 10*3/uL (ref 150–400)
RBC: 4.92 MIL/uL (ref 3.87–5.11)
RDW: 14 % (ref 11.5–15.5)
WBC: 6.6 10*3/uL (ref 4.0–10.5)
nRBC: 0 % (ref 0.0–0.2)

## 2020-06-11 LAB — BASIC METABOLIC PANEL
Anion gap: 10 (ref 5–15)
BUN: 10 mg/dL (ref 6–20)
CO2: 21 mmol/L — ABNORMAL LOW (ref 22–32)
Calcium: 8.6 mg/dL — ABNORMAL LOW (ref 8.9–10.3)
Chloride: 98 mmol/L (ref 98–111)
Creatinine, Ser: 0.66 mg/dL (ref 0.44–1.00)
GFR, Estimated: >60 mL/min (ref >60)
Glucose, Bld: 341 mg/dL — ABNORMAL HIGH (ref 70–99)
Potassium: 3.8 mmol/L (ref 3.5–5.1)
Sodium: 129 mmol/L — ABNORMAL LOW (ref 135–145)

## 2020-06-11 LAB — CBG MONITORING, ED: Glucose-Capillary: 309 mg/dL — ABNORMAL HIGH (ref 70–99)

## 2020-06-11 MED ORDER — ACETAMINOPHEN 500 MG PO TABS
1000.0000 mg | ORAL_TABLET | Freq: Once | ORAL | Status: AC
  Administered 2020-06-12: 1000 mg via ORAL
  Filled 2020-06-11: qty 2

## 2020-06-11 MED ORDER — SODIUM CHLORIDE 0.9 % IV BOLUS
1000.0000 mL | Freq: Once | INTRAVENOUS | Status: AC
  Administered 2020-06-12: 1000 mL via INTRAVENOUS

## 2020-06-11 NOTE — Progress Notes (Deleted)
Established patient visit   Patient: Casey Reese   DOB: 1990/08/12   30 y.o. Female  MRN: [id number] Visit Date: 06/11/2020  Today's healthcare provider: Lelon Huh, MD   No chief complaint on file.  Subjective    HPI  Diabetes Mellitus Type II, Follow-up  Lab Results  Component Value Date   HGBA1C 7.3 (A) 08/31/2018   HGBA1C 6.9 06/03/2017   HGBA1C 7.5 03/06/2017   Wt Readings from Last 3 Encounters:  03/30/20 268 lb 4.8 oz (121.7 kg)  05/06/19 242 lb (109.8 kg)  08/31/18 264 lb (119.7 kg)   Last seen for diabetes 1.5 years ago.  Management since then includes ***. She reports {excellent/good/fair/poor:19665} compliance with treatment. She {is/is not:21021397} having side effects. {document side effects if present:1} Symptoms: {Yes/No:20286} fatigue {Yes/No:20286} foot ulcerations  {Yes/No:20286} appetite changes {Yes/No:20286} nausea  {Yes/No:20286} paresthesia of the feet  {Yes/No:20286} polydipsia  {Yes/No:20286} polyuria {Yes/No:20286} visual disturbances   {Yes/No:20286} vomiting     Home blood sugar records: {diabetes glucometry results:16657}  Episodes of hypoglycemia? {Yes/No:20286} {enter symptoms and frequency of symptoms if yes:1}   Current insulin regiment: {enter 'none' or type of insulin and number of units taken with each dose of each insulin formulation that the patient is taking:1} Most Recent Eye Exam: *** {Current exercise:16438:::1} {Current diet habits:16563:::1}  Pertinent Labs: Lab Results  Component Value Date   CHOL 134 07/09/2015   HDL 46 07/09/2015   LDLCALC 78 07/09/2015   TRIG 50 07/09/2015   CHOLHDL 2.9 07/09/2015   Lab Results  Component Value Date   NA 139 06/04/2017   K 4.0 06/04/2017   CREATININE 0.66 06/04/2017   GFRNONAA 121 06/04/2017   GFRAA 139 06/04/2017   GLUCOSE 130 (H) 06/04/2017     ---------------------------------------------------------------------------------------------------   {Show  patient history (optional):23778::" "}   Medications: Outpatient Medications Prior to Visit  Medication Sig  . acetaminophen (TYLENOL) 500 MG tablet Take 1 tablet (500 mg total) by mouth every 6 (six) hours as needed.  Marland Kitchen albuterol (PROVENTIL HFA) 108 (90 Base) MCG/ACT inhaler Inhale 2 puffs into the lungs every 6 (six) hours as needed.  . Baclofen 5 MG TABS Take 5 mg by mouth 3 (three) times daily as needed.  . blood glucose meter kit and supplies KIT Dispense based on patient and insurance preference to check sugar once daily. (FOR ICD-9 250.00)  . Blood Glucose Monitoring Suppl (ONE TOUCH ULTRA 2) w/Device KIT Use to check sugar daily for type 2 diabetes E11.9  . chlorhexidine (PERIDEX) 0.12 % solution 15 mLs 2 (two) times daily.  . chlorthalidone (HYGROTON) 25 MG tablet Take 1 tablet (25 mg total) by mouth daily.  Marland Kitchen diltiazem (CARTIA XT) 120 MG 24 hr capsule TAKE 1 CAPSULE BY MOUTH EVERY DAY  . ferrous gluconate (FERGON) 324 MG tablet Take 1 tablet (324 mg total) by mouth daily.  Marland Kitchen glucose blood (ONETOUCH ULTRA) test strip Use to check blood sugar daily for type 2 diabetes E11.9  . ibuprofen (ADVIL) 600 MG tablet Take 1 tablet (600 mg total) by mouth every 8 (eight) hours as needed.  . Lancets (ONETOUCH ULTRASOFT) lancets Use to check blood sugar daily for type 2 diabetes E11.9  . metFORMIN (GLUCOPHAGE-XR) 500 MG 24 hr tablet Take two tablets daily (Patient not taking: Reported on 05/22/2020)  . omeprazole (PRILOSEC) 40 MG capsule Take 1 capsule (40 mg total) by mouth daily.  . ondansetron (ZOFRAN ODT) 4 MG disintegrating tablet Take 1 tablet (  4 mg total) by mouth every 8 (eight) hours as needed for nausea or vomiting.  . sucralfate (CARAFATE) 1 g tablet TAKE 1 TABLET(1 GRAM) BY MOUTH FOUR TIMES DAILY WITH MEALS AND AT BEDTIME   No facility-administered medications prior to visit.    Review of Systems  {Labs  Heme  Chem  Endocrine  Serology  Results Review (optional):23779::" "}    Objective    There were no vitals taken for this visit. {Show previous vital signs (optional):23777::" "}   Physical Exam  ***  No results found for any visits on 06/11/20.  Assessment & Plan     ***  No follow-ups on file.      {provider attestation***:1}   Lelon Huh, MD  [facility] [phone] (phone) [phone] (fax)  [place]

## 2020-06-11 NOTE — ED Provider Notes (Addendum)
[facility] Emergency Department Provider Note   ____________________________________________   Event Date/Time   First MD Initiated Contact with Patient 06/11/20 2357     (approximate)  I have reviewed the triage vital signs and the nursing notes.   HISTORY  Chief Complaint Hyperglycemia    HPI Casey Reese is a 30 y.o. female who presents to the ED from home with a chief complaint of hyperglycemia.  Patient has a history of type 2 diabetes who has not taken her prescribed antihypertensive or Metformin for 1 month.  Complains of generalized fatigue all day which she believes is related to her hyperglycemia.  Patient tested positive for COVID-19 on 05/22/2020; she is unvaccinated.  States she was exposed to her nephew who had a GI bug over the weekend and she has had a few bouts of diarrhea.  Denies cough, chest pain, shortness of breath, abdominal pain, nausea, vomiting or dysuria.      Past Medical History:  Diagnosis Date  . Diabetes mellitus without complication ([place])   . History of chicken pox   . Hypertension   . S/P above knee amputation ([place])   . 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 Viral upper respiratory tract infection 11/18/2019  . Menorrhagia with irregular cycle 07/01/2017  . Iron deficiency anemia 06/29/2017  . Anxiety 06/09/2017  . GERD (gastroesophageal reflux disease) 05/23/2016  . Helicobacter pylori gastritis 07/09/2015  . Hypertension associated with diabetes ([place]) 06/29/2015  . Diabetes mellitus ([place]) 06/04/2015  . Obesity, Class III, BMI 40-49.9 (morbid obesity) ([place]) 01/18/2008  . 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 ABOVE KNEE LEG AMPUTATION Left 2003  . HIP FUSION Left     Prior to Admission medications   Medication Sig Start Date End Date Taking? Authorizing  Provider  acetaminophen (TYLENOL) 500 MG tablet Take 1 tablet (500 mg total) by mouth every 6 (six) hours as needed. 05/06/19   Laban Emperor, PA-C  albuterol (PROVENTIL HFA) 108 (90 Base) MCG/ACT inhaler Inhale 2 puffs into the lungs every 6 (six) hours as needed. 05/22/20   Birdie Sons, MD  Baclofen 5 MG TABS Take 5 mg by mouth 3 (three) times daily as needed. 05/06/19   Laban Emperor, PA-C  blood glucose meter kit and supplies KIT Dispense based on patient and insurance preference to check sugar once daily. (FOR ICD-9 250.00) 03/06/17   Birdie Sons, MD  Blood Glucose Monitoring Suppl (ONE TOUCH ULTRA 2) w/Device KIT Use to check sugar daily for type 2 diabetes E11.9 05/22/20   Birdie Sons, MD  chlorhexidine (PERIDEX) 0.12 % solution 15 mLs 2 (two) times daily. 10/31/19   [provider]  chlorthalidone (HYGROTON) 25 MG tablet Take 1 tablet (25 mg total) by mouth daily. 06/12/20   Paulette Blanch, MD  diltiazem (CARTIA XT) 120 MG 24 hr capsule TAKE 1 CAPSULE BY MOUTH EVERY DAY 06/03/17   Birdie Sons, MD  ferrous gluconate (FERGON) 324 MG tablet Take 1 tablet (324 mg total) by mouth daily. 06/09/18   Birdie Sons, MD  glucose blood [facility] ULTRA) test strip Use to check blood sugar daily for type 2 diabetes E11.9 05/22/20   Birdie Sons, MD  ibuprofen (ADVIL) 600 MG tablet Take 1 tablet (600 mg total) by mouth every 8 (eight) hours  as needed. 03/30/20   Trinna Post, PA-C  Lancets [facility] ULTRASOFT) lancets Use to check blood sugar daily for type 2 diabetes E11.9 05/22/20   Birdie Sons, MD  metFORMIN (GLUCOPHAGE-XR) 500 MG 24 hr tablet Take two tablets daily 06/12/20   Paulette Blanch, MD  omeprazole (PRILOSEC) 40 MG capsule Take 1 capsule (40 mg total) by mouth daily. 06/09/18   Birdie Sons, MD  ondansetron (ZOFRAN ODT) 4 MG disintegrating tablet Take 1 tablet (4 mg total) by mouth every 8 (eight) hours as needed for nausea or vomiting. 06/16/18   Birdie Sons,  MD  sucralfate (CARAFATE) 1 g tablet TAKE 1 TABLET(1 GRAM) BY MOUTH FOUR TIMES DAILY WITH MEALS AND AT BEDTIME 06/09/18   Birdie Sons, MD    Allergies Sulfa antibiotics  Family History  Problem Relation Age of Onset  . Diabetes Mother        type 2    Social History Social History   Tobacco Use  . Smoking status: Current Some Day Smoker    Types: Cigarettes  . Smokeless tobacco: Never Used  . Tobacco comment: 1 pack every 2 weeks. Started smoking at age 23  Substance Use Topics  . Alcohol use: Yes    Alcohol/week: 0.0 standard drinks    Comment: occasionally  . Drug use: No    Review of Systems  Constitutional: Positive for fatigue.  No fever/chills Eyes: No visual changes. ENT: No sore throat. Cardiovascular: Denies chest pain. Respiratory: Denies shortness of breath. Gastrointestinal: No abdominal pain.  No nausea, no vomiting.  Positive for diarrhea.  No constipation. Genitourinary: Negative for dysuria. Musculoskeletal: Negative for back pain. Skin: Negative for rash. Neurological: Negative for headaches, focal weakness or numbness.   ____________________________________________   PHYSICAL EXAM:  VITAL SIGNS: ED Triage Vitals  Enc Vitals Group     BP 06/11/20 2044 (!) 194/114     Pulse Rate 06/11/20 2044 (!) 123     Resp 06/11/20 2044 20     Temp 06/11/20 2044 99.5 F (37.5 C)     Temp Source 06/11/20 2044 Oral     SpO2 06/11/20 2044 97 %     Weight 06/11/20 2046 265 lb (120.2 kg)     Height 06/11/20 2046 5' 6"  (1.676 m)     Head Circumference --      Peak Flow --      Pain Score 06/11/20 2045 0     Pain Loc --      Pain Edu? --      Excl. in [place]? --     Constitutional: Alert and oriented. Well appearing and in no acute distress. Eyes: Conjunctivae are normal. PERRL. EOMI. Head: Atraumatic. Nose: No congestion/rhinnorhea. Mouth/Throat: Mucous membranes are moist.   Neck: No stridor.   Cardiovascular: Tachycardic rate, regular rhythm.  Grossly normal heart sounds.  Good peripheral circulation. Respiratory: Normal respiratory effort.  No retractions. Lungs CTAB. Gastrointestinal: Soft and nontender to light or deep palpation. No distention. No abdominal bruits. No CVA tenderness. Musculoskeletal: Left AKA noted.  No lower extremity tenderness nor edema.  No joint effusions. Neurologic:  Normal speech and language. No gross focal neurologic deficits are appreciated.  Skin:  Skin is warm, dry and intact. No rash noted.  No petechiae. Psychiatric: Mood and affect are normal. Speech and behavior are normal.  ____________________________________________   LABS (all labs ordered are listed, but only abnormal results are displayed)  Labs Reviewed  BASIC METABOLIC PANEL -  Abnormal; Notable for the following components:      Result Value   Sodium 129 (*)    CO2 21 (*)    Glucose, Bld 341 (*)    Calcium 8.6 (*)    All other components within normal limits  CBC - Abnormal; Notable for the following components:   MCV 76.2 (*)    MCH 24.6 (*)    All other components within normal limits  URINALYSIS, COMPLETE (UACMP) WITH MICROSCOPIC - Abnormal; Notable for the following components:   Color, Urine YELLOW (*)    APPearance CLEAR (*)    Glucose, UA >=500 (*)    All other components within normal limits  CBG MONITORING, ED - Abnormal; Notable for the following components:   Glucose-Capillary 309 (*)    All other components within normal limits  CBG MONITORING, ED - Abnormal; Notable for the following components:   Glucose-Capillary 291 (*)    All other components within normal limits  CULTURE, BLOOD (ROUTINE X 2)  URINE CULTURE  CULTURE, BLOOD (ROUTINE X 2) W REFLEX TO ID PANEL  LACTIC ACID, PLASMA  HEPATIC FUNCTION PANEL  LIPASE, BLOOD  MONONUCLEOSIS SCREEN  POC URINE PREG, ED  POC SARS CORONAVIRUS 2 AG -  ED    ____________________________________________  EKG  None ____________________________________________  RADIOLOGY Cecilie Kicks J, personally viewed and evaluated these images (plain radiographs) as part of my medical decision making, as well as reviewing the written report by the radiologist.  ED MD interpretation: No acute cardiopulmonary process; unremarkable CT abdomen/pelvis  Official radiology report(s): DG Chest 2 View  Result Date: 06/12/2020 CLINICAL DATA:  Fever and weakness EXAM: CHEST - 2 VIEW COMPARISON:  02/02/2017 FINDINGS: The heart size and mediastinal contours are within normal limits. Both lungs are clear. The visualized skeletal structures are unremarkable. IMPRESSION: No active cardiopulmonary disease. Electronically Signed   By: Ulyses Jarred M.D.   On: 06/12/2020 00:32   CT Abdomen Pelvis W Contrast  Result Date: 06/12/2020 CLINICAL DATA:  Abdominal pain and fever.  Hyperglycemia.  Fatigue. EXAM: CT ABDOMEN AND PELVIS WITH CONTRAST TECHNIQUE: Multidetector CT imaging of the abdomen and pelvis was performed using the standard protocol following bolus administration of intravenous contrast. CONTRAST:  170m OMNIPAQUE IOHEXOL 300 MG/ML  SOLN COMPARISON:  CT abdomen pelvis 06/27/2015 FINDINGS: Lower chest: No acute abnormality. Hepatobiliary: The liver is enlarged in size measuring up to at least 24 cm. The hepatic parenchyma is diffusely hypodense compared to the splenic parenchyma consistent with fatty infiltration. Slightly more conspicuous 3 cm fluid density lesion within the left hepatic lobe along the false form ligament likely represents a simple hepatic cyst. No gallstones, gallbladder wall thickening, or pericholecystic fluid. No biliary dilatation. Pancreas: No focal lesion. Normal pancreatic contour. No surrounding inflammatory changes. No main pancreatic ductal dilatation. Spleen: Normal in size without focal abnormality. Couple splenules are noted. Adrenals/Urinary  Tract: No adrenal nodule bilaterally. Bilateral kidneys enhance symmetrically. No hydronephrosis. No hydroureter. The urinary bladder is unremarkable. Stomach/Bowel: PO contrast reaches the sigmoid colon. Stomach is within normal limits. No evidence of bowel wall thickening or dilatation. The appendix is not definitely identified. Vascular/Lymphatic: No abdominal aorta or iliac aneurysm. Mild atherosclerotic plaque of the aorta and its branches. No abdominal, pelvic, or inguinal lymphadenopathy. Reproductive: Uterus and bilateral adnexa are unremarkable. Other: No intraperitoneal free fluid. No intraperitoneal free gas. No organized fluid collection. Musculoskeletal: Asymmetrically atrophic left iliopsoas and left hip musculature. Similar-appearing marked dystrophic changes of the left hip with diffusely decreased  bone density and pseudoarticulation. Surgical hardware again noted. No cortical erosion or destruction. No suspicious lytic or blastic osseous lesions. No acute displaced fracture. IMPRESSION: 1. No acute intra-abdominal or intrapelvic abnormality. 2. Hepatic steatosis and hepatomegaly. Electronically Signed   By: Iven Finn M.D.   On: 06/12/2020 05:43    ____________________________________________   PROCEDURES  Procedure(s) performed (including Critical Care):  Procedures   ____________________________________________   INITIAL IMPRESSION / ASSESSMENT AND PLAN / ED COURSE  As part of my medical decision making, I reviewed the following data within the [place] History obtained from family, Nursing notes reviewed and incorporated, Labs reviewed, Old chart reviewed, Radiograph reviewed and Notes from prior ED visits     30 year old diabetic off her medications x1 month who presents with fatigue and hyperglycemia.  Fever noted on repeat vital check.  Differential diagnosis includes but is not limited to viral process, CAP, UTI, etc.  Laboratory results  demonstrate hyperglycemia without elevation of anion gap.  Will check blood cultures, lactic acid, chest x-ray.  Initiate IV fluid resuscitation, Tylenol for fever.  Will obtain Covid antigen swab to check infectivity.  Will reassess.  Clinical Course as of 06/12/20 7829  Tue Jun 12, 2020  0125 Noted unremarkable chest x-ray, UA and lactic acid.  Given patient's complaint of diarrhea, will obtain CT abdomen/pelvis to evaluate infectious etiology. [JS]  0607 Updated patient on negative CT scan.  Blood cultures are pending.  No sepsis criteria for admission.  Will restart Metformin.  Strict return precautions given.  Patient verbalizes understanding agrees with plan of care. [JS]  [id number] Patient states her doctor discontinued her diltiazem but also needs a refill on her Chlorthiadone. [JS]    Clinical Course User Index [JS] Paulette Blanch, MD     ____________________________________________   FINAL CLINICAL IMPRESSION(S) / ED DIAGNOSES  Final diagnoses:  Hyperglycemia  Fever, unspecified fever cause  Diarrhea, unspecified type     ED Discharge Orders         Ordered    metFORMIN (GLUCOPHAGE-XR) 500 MG 24 hr tablet        06/12/20 0608    chlorthalidone (HYGROTON) 25 MG tablet  Daily        06/12/20 5621          *Please note:  Casey Reese was evaluated in Emergency Department on 06/12/2020 for the symptoms described in the history of present illness. She was evaluated in the context of the global COVID-19 pandemic, which necessitated consideration that the patient might be at risk for infection with the SARS-CoV-2 virus that causes COVID-19. Institutional protocols and algorithms that pertain to the evaluation of patients at risk for COVID-19 are in a state of rapid change based on information released by regulatory bodies including the CDC and federal and state organizations. These policies and algorithms were followed during the patient's care in the ED.  Some ED evaluations and  interventions may be delayed as a result of limited staffing during and the pandemic.*   Note:  This document was prepared using Dragon voice recognition software and may include unintentional dictation errors.   Paulette Blanch, MD 06/12/20 3086    Paulette Blanch, MD 06/12/20 [id number]

## 2020-06-11 NOTE — ED Triage Notes (Addendum)
Pt presents via POV with c/o hyperglycemia. Pt states sugars at home were in the 300s. Pt c/o generalized fatigue all day that she believes is related to her blood sugar. Pt has hx of type 2 diabetes.   Pt is prescribed hypertension medication and metformin for diabetes that she has not taken in about 1 month.

## 2020-06-11 NOTE — ED Provider Notes (Incomplete)
[facility] Emergency Department Provider Note   ____________________________________________   Event Date/Time   First MD Initiated Contact with Patient 06/11/20 2357     (approximate)  I have reviewed the triage vital signs and the nursing notes.   HISTORY  Chief Complaint Hyperglycemia    HPI Casey Reese is a 30 y.o. female ***        {**SYMPTOM/COMPLAINT  LOCATION (describe anatomically) DURATION (when did it start) TIMING (onset and pattern) SEVERITY (0-10, mild/moderate/severe) QUALITY (description of symptoms) CONTEXT (recent surgery, new meds, activity, etc.) MODIFYINGFACTORS (what makes it better/worse) ASSOCIATEDSYMPTOMS (pertinent positives and negatives)**} Past Medical History:  Diagnosis Date  . Diabetes mellitus without complication ([place])   . History of chicken pox   . Hypertension   . S/P above knee amputation ([place])   . 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 Viral upper respiratory tract infection 11/18/2019  . Menorrhagia with irregular cycle 07/01/2017  . Iron deficiency anemia 06/29/2017  . Anxiety 06/09/2017  . GERD (gastroesophageal reflux disease) 05/23/2016  . Helicobacter pylori gastritis 07/09/2015  . Hypertension associated with diabetes ([place]) 06/29/2015  . Diabetes mellitus ([place]) 06/04/2015  . Obesity, Class III, BMI 40-49.9 (morbid obesity) ([place]) 01/18/2008  . 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 ABOVE KNEE LEG AMPUTATION Left 2003  . HIP FUSION Left     Prior to Admission medications   Medication Sig Start Date End Date Taking? Authorizing Provider  acetaminophen (TYLENOL) 500 MG tablet Take 1 tablet (500 mg total) by mouth every 6 (six) hours as needed. 05/06/19   Laban Emperor, PA-C  albuterol (PROVENTIL HFA) 108 (90 Base) MCG/ACT inhaler  Inhale 2 puffs into the lungs every 6 (six) hours as needed. 05/22/20   Birdie Sons, MD  Baclofen 5 MG TABS Take 5 mg by mouth 3 (three) times daily as needed. 05/06/19   Laban Emperor, PA-C  blood glucose meter kit and supplies KIT Dispense based on patient and insurance preference to check sugar once daily. (FOR ICD-9 250.00) 03/06/17   Birdie Sons, MD  Blood Glucose Monitoring Suppl (ONE TOUCH ULTRA 2) w/Device KIT Use to check sugar daily for type 2 diabetes E11.9 05/22/20   Birdie Sons, MD  chlorhexidine (PERIDEX) 0.12 % solution 15 mLs 2 (two) times daily. 10/31/19   [provider]  chlorthalidone (HYGROTON) 25 MG tablet Take 1 tablet (25 mg total) by mouth daily. 03/17/16   Birdie Sons, MD  diltiazem (CARTIA XT) 120 MG 24 hr capsule TAKE 1 CAPSULE BY MOUTH EVERY DAY 06/03/17   Birdie Sons, MD  ferrous gluconate (FERGON) 324 MG tablet Take 1 tablet (324 mg total) by mouth daily. 06/09/18   Birdie Sons, MD  glucose blood (ONETOUCH ULTRA) test strip Use to check blood sugar daily for type 2 diabetes E11.9 05/22/20   Birdie Sons, MD  ibuprofen (ADVIL) 600 MG tablet Take 1 tablet (600 mg total) by mouth every 8 (eight) hours as needed. 03/30/20   Trinna Post, PA-C  Lancets [facility] ULTRASOFT) lancets Use to check blood sugar daily for type 2 diabetes E11.9 05/22/20   Birdie Sons, MD  metFORMIN (GLUCOPHAGE-XR) 500 MG 24 hr tablet Take two tablets daily Patient not taking: Reported on 05/22/2020 03/17/16   Birdie Sons, MD  omeprazole (PRILOSEC) 40 MG capsule Take 1 capsule (40 mg total) by mouth daily. 06/09/18   Birdie Sons, MD  ondansetron (ZOFRAN ODT) 4 MG disintegrating tablet Take 1 tablet (4 mg total) by mouth every 8 (eight) hours as needed for nausea or vomiting. 06/16/18   Birdie Sons, MD  sucralfate (CARAFATE) 1 g tablet TAKE 1 TABLET(1 GRAM) BY MOUTH FOUR TIMES DAILY WITH MEALS AND AT BEDTIME 06/09/18   Birdie Sons, MD     Allergies Sulfa antibiotics  Family History  Problem Relation Age of Onset  . Diabetes Mother        type 2    Social History Social History   Tobacco Use  . Smoking status: Current Some Day Smoker    Types: Cigarettes  . Smokeless tobacco: Never Used  . Tobacco comment: 1 pack every 2 weeks. Started smoking at age 75  Substance Use Topics  . Alcohol use: Yes    Alcohol/week: 0.0 standard drinks    Comment: occasionally  . Drug use: No    Review of Systems {** Revise as appropriate then delete this line - Documentation of 10 systems is required  **} Constitutional: No fever/chills Eyes: No visual changes. ENT: No sore throat. Cardiovascular: Denies chest pain. Respiratory: Denies shortness of breath. Gastrointestinal: No abdominal pain.  No nausea, no vomiting.  No diarrhea.  No constipation. Genitourinary: Negative for dysuria. Musculoskeletal: Negative for back pain. Skin: Negative for rash. Neurological: Negative for headaches, focal weakness or numbness. {**Psychiatric:  Endocrine:  Hematological/Lymphatic:  Allergic/Immunilogical: **}  ____________________________________________   PHYSICAL EXAM:  VITAL SIGNS: ED Triage Vitals  Enc Vitals Group     BP 06/11/20 2044 (!) 194/114     Pulse Rate 06/11/20 2044 (!) 123     Resp 06/11/20 2044 20     Temp 06/11/20 2044 99.5 F (37.5 C)     Temp Source 06/11/20 2044 Oral     SpO2 06/11/20 2044 97 %     Weight 06/11/20 2046 265 lb (120.2 kg)     Height 06/11/20 2046 5' 6"  (1.676 m)     Head Circumference --      Peak Flow --      Pain Score 06/11/20 2045 0     Pain Loc --      Pain Edu? --      Excl. in [place]? --    {** Revise as appropriate then delete this line - 8 systems required **} Constitutional: Alert and oriented. Well appearing and in no acute distress. Eyes: Conjunctivae are normal. PERRL. EOMI. Head: Atraumatic. Nose: No congestion/rhinnorhea. Mouth/Throat: Mucous membranes are moist.   Oropharynx non-erythematous. Neck: No stridor.  {**No cervical spine tenderness to palpation.**} {**Hematological/Lymphatic/Immunilogical: No cervical lymphadenopathy. **}Cardiovascular: Normal rate, regular rhythm. Grossly normal heart sounds.  Good peripheral circulation. Respiratory: Normal respiratory effort.  No retractions. Lungs CTAB. Gastrointestinal: Soft and nontender. No distention. No abdominal bruits. No CVA tenderness. {**Genitourinary:  **}Musculoskeletal: No lower extremity tenderness nor edema.  No joint effusions. Neurologic:  Normal speech and language. No gross focal neurologic deficits are appreciated. No gait instability. Skin:  Skin is warm, dry and intact. No rash noted. Psychiatric: Mood and affect are normal. Speech and behavior are normal.  ____________________________________________   LABS (all labs ordered are listed, but only abnormal results are displayed)  Labs Reviewed  BASIC METABOLIC PANEL - Abnormal; Notable for the following components:      Result Value   Sodium 129 (*)  CO2 21 (*)    Glucose, Bld 341 (*)    Calcium 8.6 (*)    All other components within normal limits  CBC - Abnormal; Notable for the following components:   MCV 76.2 (*)    MCH 24.6 (*)    All other components within normal limits  CBG MONITORING, ED - Abnormal; Notable for the following components:   Glucose-Capillary 309 (*)    All other components within normal limits  CULTURE, BLOOD (ROUTINE X 2)  CULTURE, BLOOD (ROUTINE X 2)  URINE CULTURE  URINALYSIS, COMPLETE (UACMP) WITH MICROSCOPIC  LACTIC ACID, PLASMA  LACTIC ACID, PLASMA  HEPATIC FUNCTION PANEL  LIPASE, BLOOD  MONONUCLEOSIS SCREEN  CBG MONITORING, ED  POC URINE PREG, ED  POC SARS CORONAVIRUS 2 AG -  ED   ____________________________________________  EKG  *** ____________________________________________  RADIOLOGY Cecilie Kicks J, personally viewed and evaluated these images (plain radiographs) as  part of my medical decision making, as well as reviewing the written report by the radiologist.  ED MD interpretation:  ***  Official radiology report(s): No results found.  ____________________________________________   PROCEDURES  Procedure(s) performed (including Critical Care):  Procedures   ____________________________________________   INITIAL IMPRESSION / ASSESSMENT AND PLAN / ED COURSE  As part of my medical decision making, I reviewed the following data within the electronic MEDICAL RECORD NUMBER {[id number]::"Notes from prior ED visits","[place] Controlled Substance Database"}        ***      ____________________________________________   FINAL CLINICAL IMPRESSION(S) / ED DIAGNOSES  Final diagnoses:  Hyperglycemia  Fever, unspecified fever cause     ED Discharge Orders    None      *Please note:  Casey Reese was evaluated in Emergency Department on 06/11/2020 for the symptoms described in the history of present illness. She was evaluated in the context of the global COVID-19 pandemic, which necessitated consideration that the patient might be at risk for infection with the SARS-CoV-2 virus that causes COVID-19. Institutional protocols and algorithms that pertain to the evaluation of patients at risk for COVID-19 are in a state of rapid change based on information released by regulatory bodies including the CDC and federal and state organizations. These policies and algorithms were followed during the patient's care in the ED.  Some ED evaluations and interventions may be delayed as a result of limited staffing during and the pandemic.*   Note:  This document was prepared using Dragon voice recognition software and may include unintentional dictation errors.

## 2020-06-12 ENCOUNTER — Emergency Department: Payer: Medicare Other

## 2020-06-12 DIAGNOSIS — R109 Unspecified abdominal pain: Secondary | ICD-10-CM | POA: Diagnosis not present

## 2020-06-12 DIAGNOSIS — E1165 Type 2 diabetes mellitus with hyperglycemia: Secondary | ICD-10-CM | POA: Diagnosis not present

## 2020-06-12 DIAGNOSIS — R531 Weakness: Secondary | ICD-10-CM | POA: Diagnosis not present

## 2020-06-12 DIAGNOSIS — K760 Fatty (change of) liver, not elsewhere classified: Secondary | ICD-10-CM | POA: Diagnosis not present

## 2020-06-12 DIAGNOSIS — R509 Fever, unspecified: Secondary | ICD-10-CM | POA: Diagnosis not present

## 2020-06-12 LAB — HEPATIC FUNCTION PANEL
ALT: 23 U/L (ref 0–44)
AST: 30 U/L (ref 15–41)
Albumin: 3.9 g/dL (ref 3.5–5.0)
Alkaline Phosphatase: 90 U/L (ref 38–126)
Bilirubin, Direct: 0.1 mg/dL (ref 0.0–0.2)
Indirect Bilirubin: 0.8 mg/dL (ref 0.3–0.9)
Total Bilirubin: 0.9 mg/dL (ref 0.3–1.2)
Total Protein: 7.8 g/dL (ref 6.5–8.1)

## 2020-06-12 LAB — POC URINE PREG, ED: Preg Test, Ur: NEGATIVE

## 2020-06-12 LAB — URINALYSIS, COMPLETE (UACMP) WITH MICROSCOPIC
Bacteria, UA: NONE SEEN
Bilirubin Urine: NEGATIVE
Glucose, UA: >=500 mg/dL — AB
Hgb urine dipstick: NEGATIVE
Ketones, ur: NEGATIVE mg/dL
Leukocytes,Ua: NEGATIVE
Nitrite: NEGATIVE
Protein, ur: NEGATIVE mg/dL
Specific Gravity, Urine: 1.026 (ref 1.005–1.030)
pH: 5 (ref 5.0–8.0)

## 2020-06-12 LAB — LIPASE, BLOOD: Lipase: 25 U/L (ref 11–51)

## 2020-06-12 LAB — CBG MONITORING, ED: Glucose-Capillary: 291 mg/dL — ABNORMAL HIGH (ref 70–99)

## 2020-06-12 LAB — LACTIC ACID, PLASMA: Lactic Acid, Venous: 1.9 mmol/L (ref 0.5–1.9)

## 2020-06-12 LAB — POC SARS CORONAVIRUS 2 AG -  ED: SARS Coronavirus 2 Ag: NEGATIVE

## 2020-06-12 MED ORDER — CHLORTHALIDONE 25 MG PO TABS: 25.0000 mg | ORAL_TABLET | Freq: Every day | ORAL | 0 refills | Status: AC

## 2020-06-12 MED ORDER — METFORMIN HCL ER 500 MG PO TB24: ORAL_TABLET | ORAL | 0 refills | Status: DC

## 2020-06-12 MED ORDER — IOHEXOL 300 MG/ML  SOLN
125.0000 mL | Freq: Once | INTRAMUSCULAR | Status: AC | PRN
  Administered 2020-06-12: 125 mL via INTRAVENOUS

## 2020-06-12 MED ORDER — IOHEXOL 12 MG/ML PO SOLN
500.0000 mL | ORAL | Status: AC
Start: 2020-06-12 — End: 2020-06-12
  Administered 2020-06-12 (×2): 500 mL via ORAL

## 2020-06-12 NOTE — ED Notes (Addendum)
Radiology made aware that pt has completed PO contrast.

## 2020-06-12 NOTE — Discharge Instructions (Signed)
Alternate Tylenol and Ibuprofen every 4 hours as needed for fever greater than 100.4 degrees F.  Restart Metformin on Thursday.  Return to the ER for worsening symptoms, persistent vomiting, difficulty breathing or other concerns.

## 2020-06-12 NOTE — ED Notes (Signed)
This RN entered room to find pt sleeping. Pt reminded she should be drinking PO contrast for CT. Pt verbalized understanding.

## 2020-06-12 NOTE — ED Notes (Signed)
Patient updated on POC. Warm blankets provided.

## 2020-06-12 NOTE — ED Notes (Signed)
Pt woken up again by this RN and reminded to drink PO contrast.

## 2020-06-13 ENCOUNTER — Telehealth (INDEPENDENT_AMBULATORY_CARE_PROVIDER_SITE_OTHER): Payer: Medicare Other | Admitting: Physician Assistant

## 2020-06-13 DIAGNOSIS — Z5329 Procedure and treatment not carried out because of patient's decision for other reasons: Secondary | ICD-10-CM

## 2020-06-13 LAB — URINE CULTURE: Culture: 30000 — AB

## 2020-06-13 NOTE — Progress Notes (Signed)
Called twice at time of appointment. No answer. No Show.

## 2020-06-14 ENCOUNTER — Telehealth: Payer: Medicare Other | Admitting: Physician Assistant

## 2020-06-14 ENCOUNTER — Other Ambulatory Visit: Payer: Self-pay

## 2020-06-14 ENCOUNTER — Telehealth (INDEPENDENT_AMBULATORY_CARE_PROVIDER_SITE_OTHER): Payer: Medicare Other | Admitting: Physician Assistant

## 2020-06-14 DIAGNOSIS — Z5329 Procedure and treatment not carried out because of patient's decision for other reasons: Secondary | ICD-10-CM

## 2020-06-14 DIAGNOSIS — Z91199 Patient's noncompliance with other medical treatment and regimen due to unspecified reason: Secondary | ICD-10-CM

## 2020-06-14 NOTE — Progress Notes (Signed)
Called twice at appointed time, no answer and patient not on video platform. NO SHOW.

## 2020-06-15 ENCOUNTER — Telehealth (INDEPENDENT_AMBULATORY_CARE_PROVIDER_SITE_OTHER): Payer: Medicare Other | Admitting: Physician Assistant

## 2020-06-15 ENCOUNTER — Ambulatory Visit: Payer: Self-pay | Admitting: *Deleted

## 2020-06-15 ENCOUNTER — Encounter: Payer: Self-pay | Admitting: Physician Assistant

## 2020-06-15 DIAGNOSIS — A084 Viral intestinal infection, unspecified: Secondary | ICD-10-CM

## 2020-06-15 MED ORDER — DICYCLOMINE HCL 10 MG PO CAPS: 10.0000 mg | ORAL_CAPSULE | Freq: Three times a day (TID) | ORAL | 0 refills | Status: DC

## 2020-06-15 MED ORDER — ONDANSETRON HCL 4 MG PO TABS: 4.0000 mg | ORAL_TABLET | Freq: Three times a day (TID) | ORAL | 0 refills | Status: AC | PRN

## 2020-06-15 NOTE — Telephone Encounter (Signed)
Multiple attempts to contact pt; will route to office for final disposition.

## 2020-06-15 NOTE — Telephone Encounter (Addendum)
3rd attempt to contact pt for completion of nurse triage;; left message on voicemail; will route to office for final disposition

## 2020-06-15 NOTE — Telephone Encounter (Signed)
Per previous nurse triage note, "Pt called with complaints of diarrhea with adominal pain; she was exposed to someone with a stomach pain on  06/10/20; her diarrhea started around the same time; she was seen in ED on 06/11/20 due to high; sh had stomach cramps at that time; her dirrhea has gotten better, but still watery, and her stomach cramps have gotten better; the pt says she feels like she has a fever but she does not have a thermometer"; recommendations made per nurse triage protocol; she verbalized understanding; decision tee completed; pt offered and accepted MyChart appt with Joycelyn Man, [facility], 06/15/20 at 1440; will route to office for notification.  Reason for Disposition . [1] MODERATE diarrhea (e.g., 4-6 times / day more than normal) AND [2] present > 48 hours (2 days)  Answer Assessment - Initial Assessment Questions 1. DIARRHEA SEVERITY: "How bad is the diarrhea?" "How many extra stools have you had in the past 24 hours than normal?"    - NO DIARRHEA (SCALE 0)   - MILD (SCALE 1-3): Few loose or mushy BMs; increase of 1-3 stools over normal daily number of stools; mild increase in ostomy output.   -  MODERATE (SCALE 4-7): Increase of 4-6 stools daily over normal; moderate increase in ostomy output. * SEVERE (SCALE 8-10; OR 'WORST POSSIBLE'): Increase of 7 or more stools daily over normal; moderate increase in ostomy output; incontinence.     2 per day 2. ONSET: "When did the diarrhea begin?"   06/08/20 3. BM CONSISTENCY: "How loose or watery is the diarrhea?"     watery 4. VOMITING: "Are you also vomiting?" If Yes, ask: "How many times in the past 24 hours?"      no 5. ABDOMINAL PAIN: "Are you having any abdominal pain?" If Yes, ask: "What does it feel like?" (e.g., crampy, dull, intermittent, constant)      Intermittent, dull, crampy; occurs after eating 6. ABDOMINAL PAIN SEVERITY: If present, ask: "How bad is the pain?"  (e.g., Scale 1-10; mild, moderate, or  severe)   - MILD (1-3): doesn't interfere with normal activities, abdomen soft and not tender to touch    - MODERATE (4-7): interferes with normal activities or awakens from sleep, tender to touch    - SEVERE (8-10): excruciating pain, doubled over, unable to do any normal activities       6-7 out of 10 7. ORAL INTAKE: If vomiting, "Have you been able to drink liquids?" "How much fluids have you had in the past 24 hours?"   n/a 8. HYDRATION: "Any signs of dehydration?" (e.g., dry mouth [not just dry lips], too weak to stand, dizziness, new weight loss) "When did you last urinate?"    no 9. EXPOSURE: "Have you traveled to a foreign country recently?" "Have you been exposed to anyone with diarrhea?" "Could you have eaten any food that was spoiled?"    Contact with sick child 10. ANTIBIOTIC USE: "Are you taking antibiotics now or have you taken antibiotics in the past 2 months?"       ? Took antibiotics for teeth 11. OTHER SYMPTOMS: "Do you have any other symptoms?" (e.g., fever, blood in stool)      ? Fever, pt does not have thermometer; pt says she had a fever in the ED 12. PREGNANCY: "Is there any chance you are pregnant?" "When was your last menstrual period?"       No December 2021 or Janunary 2022  Protocols used: [facility]

## 2020-06-15 NOTE — Progress Notes (Signed)
MyChart Video Visit    Virtual Visit via Video Note   This visit type was conducted due to national recommendations for restrictions regarding the COVID-19 Pandemic (e.g. social distancing) in an effort to limit this patient's exposure and mitigate transmission in our community. This patient is at least at moderate risk for complications without adequate follow up. This format is felt to be most appropriate for this patient at this time. Physical exam was limited by quality of the video and audio technology used for the visit.   Patient location: Home Provider location: [facility]  I discussed the limitations of evaluation and management by telemedicine and the availability of in person appointments. The patient expressed understanding and agreed to proceed.  Patient: Casey Reese   DOB: 1990-05-24   30 y.o. Female  MRN: [id number] Visit Date: 06/15/2020  Today's healthcare provider: Mar Daring, PA-C   Chief Complaint  Patient presents with  . Diarrhea   Subjective    Diarrhea  This is a new problem. The current episode started in the past 7 days. The problem has been waxing and waning. Associated symptoms include abdominal pain, bloating and headaches. Pertinent negatives include no chills, fever, myalgias or vomiting.   Patient had sick contact on 06/10/20. Went to ER on 06/11/20. Had low grade fever, uncontrolled HTN and uncontrolled T2DM. Sugars are still elevated. She is monitoring BP again. Had stopped medications for over a month and recently restarted after going to the ER. She reports after the ER she felt better for about 2 days, but symptoms returned last night and this morning with cramping and diarrhea. She reports having diarrhea only 5 times since symptoms began on 06/11/20. She is having some nausea. Appetite still good. She did have covid at the end of January 2022.  Patient Active Problem List   Diagnosis Date Noted  . Viral upper respiratory tract infection  11/18/2019  . Menorrhagia with irregular cycle 07/01/2017  . Iron deficiency anemia 06/29/2017  . Anxiety 06/09/2017  . GERD (gastroesophageal reflux disease) 05/23/2016  . Helicobacter pylori gastritis 07/09/2015  . Hypertension associated with diabetes ([place]) 06/29/2015  . Diabetes mellitus ([place]) 06/04/2015  . Obesity, Class III, BMI 40-49.9 (morbid obesity) ([place]) 01/18/2008  . 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 Diabetes mellitus without complication ([place])   . History of chicken pox   . Hypertension   . S/P above knee amputation ([place])   . 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 Smoking status: Current Some Day Smoker    Types: Cigarettes  . Smokeless tobacco: Never Used  . Tobacco comment: 1 pack every 2 weeks. Started smoking at age 14  Substance Use Topics  . Alcohol use: Yes    Alcohol/week: 0.0 standard drinks    Comment: occasionally  . Drug use: No   Allergies  Allergen Reactions  . Sulfa Antibiotics     UNKNOWN    Medications: Outpatient Medications Prior to Visit  Medication Sig  . acetaminophen (TYLENOL) 500 MG tablet Take 1 tablet (500 mg total) by mouth every 6 (six) hours as needed.  Marland Kitchen albuterol (PROVENTIL HFA) 108 (90 Base) MCG/ACT inhaler Inhale 2 puffs into the lungs every 6 (six) hours as needed.  . Baclofen 5 MG TABS Take 5 mg by mouth 3 (three) times daily as needed.  . blood glucose meter kit and  supplies KIT Dispense based on patient and insurance preference to check sugar once daily. (FOR ICD-9 250.00)  . Blood Glucose Monitoring Suppl (ONE TOUCH ULTRA 2) w/Device KIT Use to check sugar daily for type 2 diabetes E11.9  . chlorhexidine (PERIDEX) 0.12 % solution 15 mLs 2 (two) times daily.  . chlorthalidone (HYGROTON) 25 MG tablet Take 1 tablet (25 mg total) by mouth daily.  . ferrous gluconate (FERGON) 324 MG tablet Take 1 tablet (324  mg total) by mouth daily.  Marland Kitchen glucose blood (ONETOUCH ULTRA) test strip Use to check blood sugar daily for type 2 diabetes E11.9  . ibuprofen (ADVIL) 600 MG tablet Take 1 tablet (600 mg total) by mouth every 8 (eight) hours as needed.  . Lancets (ONETOUCH ULTRASOFT) lancets Use to check blood sugar daily for type 2 diabetes E11.9  . metFORMIN (GLUCOPHAGE-XR) 500 MG 24 hr tablet Take two tablets daily  . omeprazole (PRILOSEC) 40 MG capsule Take 1 capsule (40 mg total) by mouth daily.  . ondansetron (ZOFRAN ODT) 4 MG disintegrating tablet Take 1 tablet (4 mg total) by mouth every 8 (eight) hours as needed for nausea or vomiting.  . sucralfate (CARAFATE) 1 g tablet TAKE 1 TABLET(1 GRAM) BY MOUTH FOUR TIMES DAILY WITH MEALS AND AT BEDTIME  . diltiazem (CARTIA XT) 120 MG 24 hr capsule TAKE 1 CAPSULE BY MOUTH EVERY DAY (Patient not taking: Reported on 06/15/2020)   No facility-administered medications prior to visit.    Review of Systems  Constitutional: Positive for fatigue. Negative for activity change, appetite change, chills, diaphoresis, fever and unexpected weight change.  Respiratory: Negative.   Gastrointestinal: Positive for abdominal distention, abdominal pain, bloating and diarrhea. Negative for anal bleeding, blood in stool, constipation, nausea, rectal pain and vomiting.  Musculoskeletal: Negative for myalgias.  Neurological: Positive for headaches. Negative for dizziness and light-headedness.      Objective    There were no vitals taken for this visit.   Physical Exam Vitals reviewed.  Constitutional:      General: She is not in acute distress.    Appearance: Normal appearance. She is well-developed and well-nourished. She is not ill-appearing.  HENT:     Head: Normocephalic and atraumatic.  Eyes:     Extraocular Movements: EOM normal.  Pulmonary:     Effort: Pulmonary effort is normal. No respiratory distress.  Musculoskeletal:     Cervical back: Normal range of motion  and neck supple.  Neurological:     Mental Status: She is alert.  Psychiatric:        Mood and Affect: Mood and affect and mood normal.        Behavior: Behavior normal.        Thought Content: Thought content normal.        Judgment: Judgment normal.       Assessment & Plan     1. Viral gastroenteritis Will give dicyclomine for cramping and zofran for nausea as below. Push fluids. Can also use Imodium for diarrhea. Call if worsening or not improving.  - dicyclomine (BENTYL) 10 MG capsule; Take 1 capsule (10 mg total) by mouth 3 (three) times daily before meals.  Dispense: 30 capsule; Refill: 0 - ondansetron (ZOFRAN) 4 MG tablet; Take 1 tablet (4 mg total) by mouth every 8 (eight) hours as needed.  Dispense: 20 tablet; Refill: 0   No follow-ups on file.     I discussed the assessment and treatment plan with the patient. The patient was provided an  opportunity to ask questions and all were answered. The patient agreed with the plan and demonstrated an understanding of the instructions.   The patient was advised to call back or seek an in-person evaluation if the symptoms worsen or if the condition fails to improve as anticipated.  I provided 11 minutes of face-to-face time during this encounter via MyChart Video enabled encounter.  Reynolds Bowl, PA-C, have reviewed all documentation for this visit. The documentation on 06/15/20 for the exam, diagnosis, procedures, and orders are all accurate and complete.   [place] [facility] [phone] (phone) [phone] (fax)  [place]

## 2020-06-15 NOTE — Telephone Encounter (Signed)
Attempted to contact pt to complete nurse triage; left message on voicemail.

## 2020-06-15 NOTE — Telephone Encounter (Signed)
Pt called with complaints of diarrhea with adominal pain; she was exposed to someone with a stomach pain on  06/10/20; her diarrhea started around the same time; she was seen in ED on 06/11/20 due to high; sh had stomach cramps at that time; her dirrhea has gotten better, but still watery, and her stomach cramps have gotten better; the pt says she feels like she has a fever but she does not have a thermometer; call disconnected before nurse triage could be completed; attempted to contact pt; left message on voicemail.  Answer Assessment - Initial Assessment Questions 1. DIARRHEA SEVERITY: "How bad is the diarrhea?" "How many extra stools have you had in the past 24 hours than normal?"    - NO DIARRHEA (SCALE 0)   - MILD (SCALE 1-3): Few loose or mushy BMs; increase of 1-3 stools over normal daily number of stools; mild increase in ostomy output.   -  MODERATE (SCALE 4-7): Increase of 4-6 stools daily over normal; moderate increase in ostomy output. * SEVERE (SCALE 8-10; OR 'WORST POSSIBLE'): Increase of 7 or more stools daily over normal; moderate increase in ostomy output; incontinence.     2 episodes, watery 2. ONSET: "When did the diarrhea begin?"      *No Answer* 3. BM CONSISTENCY: "How loose or watery is the diarrhea?"      *No Answer* 4. VOMITING: "Are you also vomiting?" If Yes, ask: "How many times in the past 24 hours?"      *No Answer* 5. ABDOMINAL PAIN: "Are you having any abdominal pain?" If Yes, ask: "What does it feel like?" (e.g., crampy, dull, intermittent, constant)      *No Answer* 6. ABDOMINAL PAIN SEVERITY: If present, ask: "How bad is the pain?"  (e.g., Scale 1-10; mild, moderate, or severe)   - MILD (1-3): doesn't interfere with normal activities, abdomen soft and not tender to touch    - MODERATE (4-7): interferes with normal activities or awakens from sleep, tender to touch    - SEVERE (8-10): excruciating pain, doubled over, unable to do any normal activities       *No  Answer* 7. ORAL INTAKE: If vomiting, "Have you been able to drink liquids?" "How much fluids have you had in the past 24 hours?"     *No Answer* 8. HYDRATION: "Any signs of dehydration?" (e.g., dry mouth [not just dry lips], too weak to stand, dizziness, new weight loss) "When did you last urinate?"     *No Answer* 9. EXPOSURE: "Have you traveled to a foreign country recently?" "Have you been exposed to anyone with diarrhea?" "Could you have eaten any food that was spoiled?"     *No Answer* 10. ANTIBIOTIC USE: "Are you taking antibiotics now or have you taken antibiotics in the past 2 months?"       *No Answer* 11. OTHER SYMPTOMS: "Do you have any other symptoms?" (e.g., fever, blood in stool)       *No Answer* 12. PREGNANCY: "Is there any chance you are pregnant?" "When was your last menstrual period?"       *No Answer*  Protocols used: DIARRHEA-A-AH

## 2020-06-15 NOTE — Patient Instructions (Signed)
Diarrhea, Adult Diarrhea is when you pass loose and watery poop (stool) often. Diarrhea can make you feel weak and cause you to lose water in your body (get dehydrated). Losing water in your body can cause you to:  Feel tired and thirsty.  Have a dry mouth.  Go pee (urinate) less often. Diarrhea often lasts 2-3 days. However, it can last longer if it is a sign of something more serious. It is important to treat your diarrhea as told by your doctor. Follow these instructions at home: Eating and drinking Follow these instructions as told by your doctor:  Take an ORS (oral rehydration solution). This is a drink that helps you replace fluids and minerals your body lost. It is sold at pharmacies and stores.  Drink plenty of fluids, such as: ? Water. ? Ice chips. ? Diluted fruit juice. ? Low-calorie sports drinks. ? Milk, if you want.  Avoid drinking fluids that have a lot of sugar or caffeine in them.  Eat bland, easy-to-digest foods in small amounts as you are able. These foods include: ? Bananas. ? Applesauce. ? Rice. ? Low-fat (lean) meats. ? Toast. ? Crackers.  Avoid alcohol.  Avoid spicy or fatty foods.      Medicines  Take over-the-counter and prescription medicines only as told by your doctor.  If you were prescribed an antibiotic medicine, take it as told by your doctor. Do not stop using the antibiotic even if you start to feel better. General instructions  Wash your hands often using soap and water. If soap and water are not available, use a hand sanitizer. Others in your home should wash their hands as well. Hands should be washed: ? After using the toilet or changing a diaper. ? Before preparing, cooking, or serving food. ? While caring for a sick person. ? While visiting someone in a hospital.  Drink enough fluid to keep your pee (urine) pale yellow.  Rest at home while you get better.  Watch your condition for any changes.  Take a warm bath to help  with any burning or pain from having diarrhea.  Keep all follow-up visits as told by your doctor. This is important.   Contact a doctor if:  You have a fever.  Your diarrhea gets worse.  You have new symptoms.  You cannot keep fluids down.  You feel light-headed or dizzy.  You have a headache.  You have muscle cramps. Get help right away if:  You have chest pain.  You feel very weak or you pass out (faint).  You have bloody or black poop or poop that looks like tar.  You have very bad cramping, or bloating in your belly (abdomen).  You have trouble breathing or you are breathing very quickly.  Your heart is beating very quickly.  Your skin feels cold and clammy.  You feel confused.  You have signs of losing too much water in your body, such as: ? Dark pee, very little pee, or no pee. ? Cracked lips. ? Dry mouth. ? Sunken eyes. ? Sleepiness. ? Weakness. Summary  Diarrhea is when you pass loose and watery poop (stool) often.  Diarrhea can make you feel weak and cause you to lose water in your body (get dehydrated).  Take an ORS (oral rehydration solution). This is a drink that is sold at pharmacies and stores.  Eat bland, easy-to-digest foods in small amounts as you are able.  Contact a doctor if your condition gets worse. Get help right  away if you have signs that you have lost too much water in your body. This information is not intended to replace advice given to you by your health care provider. Make sure you discuss any questions you have with your health care provider. Document Revised: 09/18/2017 Document Reviewed: 09/18/2017 Elsevier Patient Education  2021 Elsevier Inc.   Food Choices to Help Relieve Diarrhea, Adult Diarrhea can make you feel weak and cause you to become dehydrated. It is important to choose the right foods and drinks to:  Relieve diarrhea.  Replace lost fluids and nutrients.  Prevent dehydration. What are tips for following  this plan? Relieving diarrhea  Avoid foods that make your diarrhea worse. These may include: ? Foods and beverages sweetened with high-fructose corn syrup, honey, or sweeteners such as xylitol, sorbitol, and mannitol. ? Fried, greasy, or spicy foods. ? Raw fruits and vegetables.  Eat foods that are rich in probiotics. These include foods such as yogurt and fermented milk products. Probiotics can help increase healthy bacteria in your stomach and intestines (gastrointestinal tract or GI tract). This may help digestion and stop diarrhea.  If you have lactose intolerance, avoid dairy products. These may make your diarrhea worse.  Take medicine to help stop diarrhea only as told by your health care provider. Replacing nutrients  Eat bland, easy-to-digest foods in small amounts as you are able, until your diarrhea starts to get better. These foods include bananas, applesauce, rice, toast, and crackers.  Gradually reintroduce nutrient-rich foods as tolerated or as told by your health care provider. This includes: ? Well-cooked protein foods, such as eggs, lean meats like fish or chicken without skin, and tofu. ? Peeled, seeded, and soft-cooked fruits and vegetables. ? Low-fat dairy products. ? Whole grains.  Take vitamin and mineral supplements as told by your health care provider.   Preventing dehydration  Start by sipping water or a solution to prevent dehydration (oral rehydration solution, ORS). This is a drink that helps replace fluids and minerals your body has lost. You can buy an ORS at pharmacies and retail stores.  Try to drink at least 8-10 cups (2,000-2,500 mL) of fluid each day to help replace lost fluids. If you have urine that is pale yellow, you are getting enough fluids.  You may drink other liquids in addition to water, such as fruit juice that you have added water to (diluted fruit juice) or low-calorie sports drinks, as tolerated or as told by your health care  provider.  Avoid drinks with caffeine, such as coffee, tea, or soft drinks.  Avoid alcohol.   Summary  When you have diarrhea, it is important to choose the right foods and drinks to relieve diarrhea, to replace lost fluids and nutrients, and to prevent dehydration.  Make sure you drink enough fluid to keep your urine pale yellow.  You may benefit from eating bland foods at first. Gradually reintroduce healthy, nutrient-rich foods as tolerated or as told by your health care provider.  Avoid foods that make your diarrhea worse, such as fried, greasy, or spicy foods. This information is not intended to replace advice given to you by your health care provider. Make sure you discuss any questions you have with your health care provider. Document Revised: 05/31/2019 Document Reviewed: 05/31/2019 Elsevier Patient Education  2021 [employer].

## 2020-06-15 NOTE — Telephone Encounter (Signed)
Contacted pt and

## 2020-06-17 LAB — CULTURE, BLOOD (ROUTINE X 2)
Culture: NO GROWTH
Special Requests: ADEQUATE

## 2020-06-20 DIAGNOSIS — F419 Anxiety disorder, unspecified: Secondary | ICD-10-CM | POA: Diagnosis not present

## 2020-06-20 DIAGNOSIS — Z89612 Acquired absence of left leg above knee: Secondary | ICD-10-CM | POA: Diagnosis not present

## 2020-06-20 DIAGNOSIS — E119 Type 2 diabetes mellitus without complications: Secondary | ICD-10-CM | POA: Diagnosis not present

## 2020-06-20 DIAGNOSIS — Z1159 Encounter for screening for other viral diseases: Secondary | ICD-10-CM | POA: Diagnosis not present

## 2020-06-20 DIAGNOSIS — K760 Fatty (change of) liver, not elsewhere classified: Secondary | ICD-10-CM | POA: Diagnosis not present

## 2020-06-20 DIAGNOSIS — N926 Irregular menstruation, unspecified: Secondary | ICD-10-CM | POA: Diagnosis not present

## 2020-06-20 DIAGNOSIS — I10 Essential (primary) hypertension: Secondary | ICD-10-CM | POA: Diagnosis not present

## 2020-06-20 DIAGNOSIS — D500 Iron deficiency anemia secondary to blood loss (chronic): Secondary | ICD-10-CM | POA: Diagnosis not present

## 2020-06-20 DIAGNOSIS — Z01419 Encounter for gynecological examination (general) (routine) without abnormal findings: Secondary | ICD-10-CM | POA: Diagnosis not present

## 2020-06-20 DIAGNOSIS — Z23 Encounter for immunization: Secondary | ICD-10-CM | POA: Diagnosis not present

## 2020-06-20 DIAGNOSIS — Z7289 Other problems related to lifestyle: Secondary | ICD-10-CM | POA: Diagnosis not present

## 2020-06-20 DIAGNOSIS — Z114 Encounter for screening for human immunodeficiency virus [HIV]: Secondary | ICD-10-CM | POA: Diagnosis not present

## 2020-07-02 ENCOUNTER — Other Ambulatory Visit: Payer: Self-pay | Admitting: Family Medicine

## 2020-07-02 DIAGNOSIS — E119 Type 2 diabetes mellitus without complications: Secondary | ICD-10-CM

## 2020-07-02 MED ORDER — METFORMIN HCL ER 500 MG PO TB24: ORAL_TABLET | ORAL | 0 refills | Status: AC

## 2020-07-02 NOTE — Telephone Encounter (Signed)
Medication: metFORMIN (GLUCOPHAGE-XR) 500 MG 24 hr tablet [[id number]]  Pt is calling stating she had been out of medication since Last Friday  Has the patient contacted their pharmacy? YES  (Agent: If no, request that the patient contact the pharmacy for the refill.) (Agent: If yes, when and what did the pharmacy advise?)  Preferred Pharmacy (with phone number or street name): [facility] - [place]line Peals, [place] - [address] [place] [address] Phone: [phone] Fax: [phone] Hours: Not open 24 hours    Agent: Please be advised that RX refills may take up to 3 business days. We ask that you follow-up with your pharmacy.

## 2020-07-02 NOTE — Telephone Encounter (Signed)
Patient overdue for follow up appointment. Patient advised and scheduled appointment for 07/17/2020 at 2:40pm. Courtesy refill provided.

## 2020-07-02 NOTE — Telephone Encounter (Signed)
   Notes to clinic: medication filled by a different provider Review for refill  Patient has been out since Friday    Requested Prescriptions  Pending Prescriptions Disp Refills   metFORMIN (GLUCOPHAGE-XR) 500 MG 24 hr tablet 30 tablet 0    Sig: Take two tablets daily      Endocrinology:  Diabetes - Biguanides Failed - 07/02/2020 12:42 PM      Failed - HBA1C is between 0 and 7.9 and within 180 days    Hemoglobin A1C  Date Value Ref Range Status  08/31/2018 7.3 (A) 4.0 - 5.6 % Final    Comment:    Average 163  08/04/2013 6.7  Final   Hgb A1c MFr Bld  Date Value Ref Range Status  07/09/2015 8.8 (H) 4.8 - 5.6 % Final    Comment:             Pre-diabetes: 5.7 - 6.4          Diabetes: >6.4          Glycemic control for adults with diabetes: <7.0           Failed - AA eGFR in normal range and within 360 days    EGFR (African American)  Date Value Ref Range Status  04/01/2014 >60 >35mL/min Final   GFR calc Af Amer  Date Value Ref Range Status  06/04/2017 139 >59 mL/min/1.73 Final   EGFR (Non-African Amer.)  Date Value Ref Range Status  04/01/2014 >60 >101mL/min Final    Comment:    eGFR values <87mL/min/1.73 m2 may be an indication of chronic kidney disease (CKD). Calculated eGFR, using the MRDR Study equation, is useful in  patients with stable renal function. The eGFR calculation will not be reliable in acutely ill patients when serum creatinine is changing rapidly. It is not useful in patients on dialysis. The eGFR calculation may not be applicable to patients at the low and high extremes of body sizes, pregnant women, and vegetarians.    GFR, Estimated  Date Value Ref Range Status  06/11/2020 >60 >60 mL/min Final    Comment:    (NOTE) Calculated using the CKD-EPI Creatinine Equation (2021)           Passed - Cr in normal range and within 360 days    Creatinine  Date Value Ref Range Status  04/01/2014 0.74 0.60 - 1.30 mg/dL Final   Creatinine, Ser   Date Value Ref Range Status  06/11/2020 0.66 0.44 - 1.00 mg/dL Final          Passed - Valid encounter within last 6 months    Recent Outpatient Visits           2 weeks ago Viral gastroenteritis   [place], [place]   2 weeks ago No-show for appointment   [place], [place]   2 weeks ago No-show for appointment   [facility] [place], [place]   1 month ago [place], Kirstie Peri, MD   3 months ago Pain, dental   [facility] [place], Fabio Bering [place], [place]

## 2020-07-17 ENCOUNTER — Ambulatory Visit: Payer: Self-pay | Admitting: Family Medicine

## 2020-07-17 NOTE — Progress Notes (Deleted)
    Established patient visit   Patient: Casey Reese   DOB: 04/08/1991   30 y.o. Female  MRN: [id number] Visit Date: 07/17/2020  Today's healthcare provider: Donald Fisher, MD   No chief complaint on file.  Subjective    HPI  Diabetes Mellitus Type II, Follow-up  Lab Results  Component Value Date   HGBA1C 7.3 (A) 08/31/2018   HGBA1C 6.9 06/03/2017   HGBA1C 7.5 03/06/2017   Wt Readings from Last 3 Encounters:  06/11/20 265 lb (120.2 kg)  03/30/20 268 lb 4.8 oz (121.7 kg)  05/06/19 242 lb (109.8 kg)   Last seen for diabetes {1-12:18279} {days/wks/mos/yrs:310907} ago.  Management since then includes ***. She reports {excellent/good/fair/poor:19665} compliance with treatment. She {is/is not:21021397} having side effects. {document side effects if present:1} Symptoms: {Yes/No:20286} fatigue {Yes/No:20286} foot ulcerations  {Yes/No:20286} appetite changes {Yes/No:20286} nausea  {Yes/No:20286} paresthesia of the feet  {Yes/No:20286} polydipsia  {Yes/No:20286} polyuria {Yes/No:20286} visual disturbances   {Yes/No:20286} vomiting     Home blood sugar records: {diabetes glucometry results:16657}  Episodes of hypoglycemia? {Yes/No:20286} {enter symptoms and frequency of symptoms if yes:1}   Current insulin regiment: {enter 'none' or type of insulin and number of units taken with each dose of each insulin formulation that the patient is taking:1} Most Recent Eye Exam: *** {Current exercise:16438:::1} {Current diet habits:16563:::1}  Pertinent Labs: Lab Results  Component Value Date   CHOL 134 07/09/2015   HDL 46 07/09/2015   LDLCALC 78 07/09/2015   TRIG 50 07/09/2015   CHOLHDL 2.9 07/09/2015   Lab Results  Component Value Date   NA 129 (L) 06/11/2020   K 3.8 06/11/2020   CREATININE 0.66 06/11/2020   GFRNONAA >60 06/11/2020   GFRAA 139 06/04/2017   GLUCOSE 341 (H) 06/11/2020      --------------------------------------------------------------------------------------------------- Hypertension, follow-up  BP Readings from Last 3 Encounters:  06/12/20 (!) 174/99  03/30/20 (!) 136/95  05/06/19 (!) 146/92   Wt Readings from Last 3 Encounters:  06/11/20 265 lb (120.2 kg)  03/30/20 268 lb 4.8 oz (121.7 kg)  05/06/19 242 lb (109.8 kg)     She was last seen for hypertension {NUMBERS 1-12:18279} {days/wks/mos/yrs:310907} ago.  BP at that visit was ***. Management since that visit includes ***.  She reports {excellent/good/fair/poor:19665} compliance with treatment. She {is/is not:9024} having side effects. {document side effects if present:1} She is following a {diet:21022986} diet. She {is/is not:9024} exercising. She {does/does not:200015} smoke.  Use of agents associated with hypertension: {bp agents assoc with hypertension:511::"none"}.   Outside blood pressures are {***enter patient reported home BP readings, or 'not being checked':1}. Symptoms: {Yes/No:20286} chest pain {Yes/No:20286} chest pressure  {Yes/No:20286} palpitations {Yes/No:20286} syncope  {Yes/No:20286} dyspnea {Yes/No:20286} orthopnea  {Yes/No:20286} paroxysmal nocturnal dyspnea {Yes/No:20286} lower extremity edema   Pertinent labs: Lab Results  Component Value Date   CHOL 134 07/09/2015   HDL 46 07/09/2015   LDLCALC 78 07/09/2015   TRIG 50 07/09/2015   CHOLHDL 2.9 07/09/2015   Lab Results  Component Value Date   NA 129 (L) 06/11/2020   K 3.8 06/11/2020   CREATININE 0.66 06/11/2020   GFRNONAA >60 06/11/2020   GFRAA 139 06/04/2017   GLUCOSE 341 (H) 06/11/2020     The ASCVD Risk score (Goff DC Jr., et al., 2013) failed to calculate for the following reasons:   The 2013 ASCVD risk score is only valid for ages 40 to 79   ---------------------------------------------------------------------------------------------------   {Show patient history (optional):23778::" "}    Medications: Outpatient Medications Prior   to Visit  Medication Sig  . acetaminophen (TYLENOL) 500 MG tablet Take 1 tablet (500 mg total) by mouth every 6 (six) hours as needed.  Marland Kitchen albuterol (PROVENTIL HFA) 108 (90 Base) MCG/ACT inhaler Inhale 2 puffs into the lungs every 6 (six) hours as needed.  . Baclofen 5 MG TABS Take 5 mg by mouth 3 (three) times daily as needed.  . blood glucose meter kit and supplies KIT Dispense based on patient and insurance preference to check sugar once daily. (FOR ICD-9 250.00)  . Blood Glucose Monitoring Suppl (ONE TOUCH ULTRA 2) w/Device KIT Use to check sugar daily for type 2 diabetes E11.9  . chlorhexidine (PERIDEX) 0.12 % solution 15 mLs 2 (two) times daily.  . chlorthalidone (HYGROTON) 25 MG tablet Take 1 tablet (25 mg total) by mouth daily.  Marland Kitchen dicyclomine (BENTYL) 10 MG capsule Take 1 capsule (10 mg total) by mouth 3 (three) times daily before meals.  Marland Kitchen diltiazem (CARTIA XT) 120 MG 24 hr capsule TAKE 1 CAPSULE BY MOUTH EVERY DAY (Patient not taking: Reported on 06/15/2020)  . ferrous gluconate (FERGON) 324 MG tablet Take 1 tablet (324 mg total) by mouth daily.  Marland Kitchen glucose blood (ONETOUCH ULTRA) test strip Use to check blood sugar daily for type 2 diabetes E11.9  . ibuprofen (ADVIL) 600 MG tablet Take 1 tablet (600 mg total) by mouth every 8 (eight) hours as needed.  . Lancets (ONETOUCH ULTRASOFT) lancets Use to check blood sugar daily for type 2 diabetes E11.9  . metFORMIN (GLUCOPHAGE-XR) 500 MG 24 hr tablet Take two tablets daily  . omeprazole (PRILOSEC) 40 MG capsule Take 1 capsule (40 mg total) by mouth daily.  . ondansetron (ZOFRAN) 4 MG tablet Take 1 tablet (4 mg total) by mouth every 8 (eight) hours as needed.  . sucralfate (CARAFATE) 1 g tablet TAKE 1 TABLET(1 GRAM) BY MOUTH FOUR TIMES DAILY WITH MEALS AND AT BEDTIME   No facility-administered medications prior to visit.    Review of Systems  {Labs  Heme  Chem  Endocrine  Serology  Results  Review (optional):23779::" "}   Objective    There were no vitals taken for this visit. {Show previous vital signs (optional):23777::" "}   Physical Exam  ***  No results found for any visits on 07/17/20.  Assessment & Plan     ***  No follow-ups on file.      {provider attestation***:1}   Lelon Huh, MD  [facility] [phone] (phone) [phone] (fax)  [place]

## 2020-08-30 ENCOUNTER — Other Ambulatory Visit: Payer: Self-pay | Admitting: Family Medicine

## 2020-08-30 DIAGNOSIS — E119 Type 2 diabetes mellitus without complications: Secondary | ICD-10-CM

## 2021-01-02 ENCOUNTER — Telehealth: Payer: Self-pay

## 2021-01-02 NOTE — Telephone Encounter (Signed)
Ms. Bruney is no longer a pt here.  She sees Dr. Rosita Fire at [facility].  I left message advising pt to call them to schedule an appointment.   Thanks,   -Vernona Rieger

## 2021-01-02 NOTE — Telephone Encounter (Signed)
Copied from CRM [id number]. Topic: General - Other >> Jan 02, 2021  3:16 PM Marylen Ponto wrote: Reason for CRM: Pt called to schedule appt with Dr. Sherrie Mustache due to pain in tubes but there were no appts available until October. Pt then asked if Dr. Sherrie Mustache would prescribe amoxicillin and send Rx to [facility] [place] [place] - [address] Phone: [phone]  Fax: [phone]

## 2021-03-13 ENCOUNTER — Emergency Department: Payer: Medicare Other

## 2021-03-13 ENCOUNTER — Other Ambulatory Visit: Payer: Self-pay

## 2021-03-13 ENCOUNTER — Emergency Department
Admission: EM | Admit: 2021-03-13 | Discharge: 2021-03-13 | Disposition: A | Discharge status: Home / Self Care | Payer: Medicare Other | Attending: Emergency Medicine | Admitting: Emergency Medicine

## 2021-03-13 DIAGNOSIS — F17210 Nicotine dependence, cigarettes, uncomplicated: Secondary | ICD-10-CM | POA: Insufficient documentation

## 2021-03-13 DIAGNOSIS — J101 Influenza due to other identified influenza virus with other respiratory manifestations: Secondary | ICD-10-CM | POA: Insufficient documentation

## 2021-03-13 DIAGNOSIS — Z20822 Contact with and (suspected) exposure to covid-19: Secondary | ICD-10-CM | POA: Insufficient documentation

## 2021-03-13 DIAGNOSIS — I10 Essential (primary) hypertension: Secondary | ICD-10-CM | POA: Insufficient documentation

## 2021-03-13 DIAGNOSIS — R0600 Dyspnea, unspecified: Secondary | ICD-10-CM | POA: Diagnosis not present

## 2021-03-13 DIAGNOSIS — E119 Type 2 diabetes mellitus without complications: Secondary | ICD-10-CM | POA: Insufficient documentation

## 2021-03-13 DIAGNOSIS — Z7984 Long term (current) use of oral hypoglycemic drugs: Secondary | ICD-10-CM | POA: Insufficient documentation

## 2021-03-13 DIAGNOSIS — U071 COVID-19: Secondary | ICD-10-CM

## 2021-03-13 DIAGNOSIS — R059 Cough, unspecified: Secondary | ICD-10-CM | POA: Diagnosis not present

## 2021-03-13 LAB — RESP PANEL BY RT-PCR (FLU A&B, COVID) ARPGX2
Influenza A by PCR: POSITIVE — AB
Influenza B by PCR: NEGATIVE
SARS Coronavirus 2 by RT PCR: NEGATIVE

## 2021-03-13 MED ORDER — BENZONATATE 100 MG PO CAPS: 100.0000 mg | ORAL_CAPSULE | Freq: Three times a day (TID) | ORAL | 0 refills | Status: AC | PRN

## 2021-03-13 MED ORDER — ONDANSETRON 4 MG PO TBDP: 4.0000 mg | ORAL_TABLET | Freq: Three times a day (TID) | ORAL | 0 refills | Status: AC | PRN

## 2021-03-13 MED ORDER — DICYCLOMINE HCL 10 MG PO CAPS: 10.0000 mg | ORAL_CAPSULE | Freq: Three times a day (TID) | ORAL | 0 refills | Status: AC

## 2021-03-13 MED ORDER — ALBUTEROL SULFATE HFA 108 (90 BASE) MCG/ACT IN AERS: 2.0000 | INHALATION_SPRAY | Freq: Four times a day (QID) | RESPIRATORY_TRACT | 1 refills | Status: AC | PRN

## 2021-03-13 NOTE — Discharge Instructions (Signed)
Zofran is for nausea. Bentyl is for abdominal pain/spasms. Albuterol is for wheezing and shortness of breath. Take 650 mg of Tylenol every four hours and 400 mg of Ibuprofen alternating.

## 2021-03-13 NOTE — ED Triage Notes (Signed)
Pt c/o cough, congestion, body aches and sore throat since Monday.

## 2021-03-13 NOTE — ED Provider Notes (Signed)
Emergency Medicine Provider Triage Evaluation Note  Casey Reese , a 30 y.o. female  was evaluated in triage.  Pt complains of cough, congestion, body aches and sore throat since Monday.  Some difficulty breathing.  Was exposed to influenza.  Review of Systems  Positive: Cough, congestion, body aches, sore throat Negative: No vomiting/diarrhea  Physical Exam  BP 140/85 (BP Location: Left Arm)   Pulse (!) 120   Temp 99.4 F (37.4 C) (Oral)   Resp 20   SpO2 95%  Gen:   Awake, no distress   Resp:  Normal effort  MSK:   Moves extremities without difficulty  Other:    Medical Decision Making  Medically screening exam initiated at 1:27 PM.  Appropriate orders placed.  Casey Reese was informed that the remainder of the evaluation will be completed by another provider, this initial triage assessment does not replace that evaluation, and the importance of remaining in the ED until their evaluation is complete.  Respiratory panel chest x-ray   Faythe Ghee, PA-C 03/13/21 1328    Vicente Males Clent Jacks, MD 03/13/21 [phone]

## 2021-03-13 NOTE — ED Provider Notes (Signed)
[facility]-EMERGENCY DEPARTMENT  ____________________________________________  Time seen: Approximately 4:04 PM  I have reviewed the triage vital signs and the nursing notes.   HISTORY  Chief Complaint URI   Historian Patient     HPI Casey Reese is a 30 y.o. female presents to the emergency department with 3 days of flulike symptoms.  Patient has had cough, body aches and pharyngitis.  She was recently exposed to someone who tested positive for the flu.  She denies chest pain, chest tightness or abdominal pain.   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 Authorizing Provider  benzonatate (TESSALON PERLES) 100 MG capsule Take 1 capsule (100 mg total) by mouth 3 (three) times daily as needed for up to 7 days for cough. 03/13/21  03/20/21 Yes Vallarie Mare M, PA-C  dicyclomine (BENTYL) 10 MG capsule Take 1 capsule (10 mg total) by mouth 4 (four) times daily -  before meals and at bedtime for 5 days. 03/13/21 03/18/21 Yes Vallarie Mare M, PA-C  ondansetron (ZOFRAN ODT) 4 MG disintegrating tablet Take 1 tablet (4 mg total) by mouth every 8 (eight) hours as needed for up to 5 days. 03/13/21 03/18/21 Yes Vallarie Mare M, PA-C  acetaminophen (TYLENOL) 500 MG tablet Take 1 tablet (500 mg total) by mouth every 6 (six) hours as needed. 05/06/19   Laban Emperor, PA-C  albuterol (PROVENTIL HFA) 108 (90 Base) MCG/ACT inhaler Inhale 2 puffs into the lungs every 6 (six) hours as needed. 03/13/21   Lannie Fields, PA-C  Baclofen 5 MG TABS Take 5 mg by mouth 3 (three) times daily as needed. 05/06/19   Laban Emperor, PA-C  blood glucose meter kit and supplies KIT Dispense based on patient and insurance preference to check sugar once daily. (FOR ICD-9 250.00) 03/06/17   Birdie Sons, MD  Blood Glucose Monitoring Suppl (ONE TOUCH ULTRA 2) w/Device KIT Use to check sugar daily for type 2 diabetes E11.9 05/22/20   Birdie Sons, MD  chlorhexidine (PERIDEX) 0.12 % solution 15 mLs 2 (two) times daily. 10/31/19   [provider]  chlorthalidone (HYGROTON) 25 MG tablet Take 1 tablet (25 mg total) by mouth daily. 06/12/20   Paulette Blanch, MD  diltiazem (CARTIA XT) 120  MG 24 hr capsule TAKE 1 CAPSULE BY MOUTH EVERY DAY Patient not taking: Reported on 06/15/2020 06/03/17   Birdie Sons, MD  ferrous gluconate (FERGON) 324 MG tablet Take 1 tablet (324 mg total) by mouth daily. 06/09/18   Birdie Sons, MD  glucose blood (ONETOUCH ULTRA) test strip Use to check blood sugar daily for type 2 diabetes E11.9 05/22/20   Birdie Sons, MD  ibuprofen (ADVIL) 600 MG tablet Take 1 tablet (600 mg total) by mouth every 8 (eight) hours as needed. 03/30/20   Trinna Post, PA-C  Lancets [facility] ULTRASOFT) lancets Use to check blood sugar daily for  type 2 diabetes E11.9 05/22/20   Birdie Sons, MD  metFORMIN (GLUCOPHAGE-XR) 500 MG 24 hr tablet Take two tablets daily 07/02/20   Birdie Sons, MD  omeprazole (PRILOSEC) 40 MG capsule Take 1 capsule (40 mg total) by mouth daily. 06/09/18   Birdie Sons, MD  ondansetron (ZOFRAN) 4 MG tablet Take 1 tablet (4 mg total) by mouth every 8 (eight) hours as needed. 06/15/20   Mar Daring, PA-C  sucralfate (CARAFATE) 1 g tablet TAKE 1 TABLET(1 GRAM) BY MOUTH FOUR TIMES DAILY WITH MEALS AND AT BEDTIME 06/09/18   Birdie Sons, MD    Allergies Sulfa antibiotics  Family History  Problem Relation Age of Onset   Diabetes Mother        type 2    Social History Social History   Tobacco Use   Smoking status: Some Days    Types: Cigarettes   Smokeless tobacco: Never   Tobacco comments:    1 pack every 2 weeks. Started smoking at age 53  Substance Use Topics   Alcohol use: Yes    Alcohol/week: 0.0 standard drinks    Comment: occasionally   Drug use: No     Review of Systems  Constitutional: Patient has fever.  Eyes: No visual changes. No discharge ENT: Patient has congestion.  Cardiovascular: no chest pain. Respiratory: Patient has cough.  Gastrointestinal: No abdominal pain.  No nausea, no vomiting. Patient had diarrhea.  Genitourinary: Negative for dysuria. No hematuria Musculoskeletal: Patient has myalgias.  Skin: Negative for rash, abrasions, lacerations, ecchymosis. Neurological: Patient has headache, no focal weakness or numbness.   ____________________________________________   PHYSICAL EXAM:  VITAL SIGNS: ED Triage Vitals [03/13/21 1325]  Enc Vitals Group     BP 140/85     Pulse Rate (!) 120     Resp 20     Temp 99.4 F (37.4 C)     Temp Source Oral     SpO2 95 %     Weight      Height      Head Circumference      Peak Flow      Pain Score      Pain Loc      Pain Edu?      Excl. in [place]?     Constitutional: Alert and oriented. Patient is  lying supine. Eyes: Conjunctivae are normal. PERRL. EOMI. Head: Atraumatic. ENT:      Ears: Tympanic membranes are mildly injected with mild effusion bilaterally.       Nose: No congestion/rhinnorhea.      Mouth/Throat: Mucous membranes are moist. Posterior pharynx is mildly erythematous.  Hematological/Lymphatic/Immunilogical: No cervical lymphadenopathy.  Cardiovascular: Normal rate, regular rhythm. Normal S1 and S2.  Good peripheral circulation. Respiratory: Normal respiratory effort without tachypnea or retractions. Lungs CTAB. Good air entry to the  bases with no decreased or absent breath sounds. Gastrointestinal: Bowel sounds 4 quadrants. Soft and nontender to palpation. No guarding or rigidity. No palpable masses. No distention. No CVA tenderness. Musculoskeletal: Full range of motion to all extremities. No gross deformities appreciated. Neurologic:  Normal speech and language. No gross focal neurologic deficits are appreciated.  Skin:  Skin is warm, dry and intact. No rash noted. Psychiatric: Mood and affect are normal. Speech and behavior are normal. Patient exhibits appropriate insight and judgement.   ____________________________________________   LABS (all labs ordered are listed, but only abnormal results are displayed)  Labs Reviewed  RESP PANEL BY RT-PCR (FLU A&B, COVID) ARPGX2 - Abnormal; Notable for the following components:      Result Value   Influenza A by PCR POSITIVE (*)    All other components within normal limits   ____________________________________________  EKG   ____________________________________________  RADIOLOGYI, Lannie Fields, personally viewed and evaluated these images (plain radiographs) as part of my medical decision making, as well as reviewing the written report by the radiologist.  DG Chest 2 View  Result Date: 03/13/2021 CLINICAL DATA:  Cough, difficulty breathing EXAM: CHEST - 2 VIEW COMPARISON:  06/12/2020 FINDINGS: Cardiac and  mediastinal contours are within normal limits. No focal pulmonary opacity. Peribronchial cuffing. No pleural effusion or pneumothorax. No acute osseous abnormality. IMPRESSION: Peribronchial cuffing, which can be seen in the setting of small airways disease (viral versus reactive). No focal pulmonary opacity to suggest pneumonia. Electronically Signed   By: Merilyn Baba M.D.   On: 03/13/2021 13:48    ____________________________________________    PROCEDURES  Procedure(s) performed:     Procedures     Medications - No data to display   ____________________________________________   INITIAL IMPRESSION / ASSESSMENT AND PLAN / ED COURSE  Pertinent labs & imaging results that were available during my care of the patient were reviewed by me and considered in my medical decision making (see chart for details).      Assessment and plan Influenza A 30 year old female presents to the emergency department with flulike symptoms.  She tested positive for influenza A.  Rest and hydration were encouraged at home.  Tylenol ibuprofen alternating were recommended for fever.  She was prescribed Zofran for nausea, Bentyl for abdominal spasms and albuterol inhaler to be used as needed for shortness of breath.  Return precautions were given to return with new or worsening symptoms.     ____________________________________________  FINAL CLINICAL IMPRESSION(S) / ED DIAGNOSES  Final diagnoses:  Influenza A      NEW MEDICATIONS STARTED DURING THIS VISIT:  ED Discharge Orders          Ordered    albuterol (PROVENTIL HFA) 108 (90 Base) MCG/ACT inhaler  Every 6 hours PRN        03/13/21 1556    ondansetron (ZOFRAN ODT) 4 MG disintegrating tablet  Every 8 hours PRN        03/13/21 1556    dicyclomine (BENTYL) 10 MG capsule  3 times daily before meals & bedtime        03/13/21 1556    benzonatate (TESSALON PERLES) 100 MG capsule  3 times daily PRN        03/13/21 1556                 This chart was dictated using voice recognition software/Dragon. Despite best efforts to proofread, errors can occur which can change the meaning. Any change was purely unintentional.  Vallarie Mare [place], PA-C 03/13/21 1606    Vladimir Crofts, MD 03/19/21 [phone]

## 2023-10-27 ENCOUNTER — Ambulatory Visit: Admitting: Podiatry

## 2024-02-11 ENCOUNTER — Ambulatory Visit: Payer: Self-pay | Admitting: Internal Medicine

## 2024-02-16 ENCOUNTER — Ambulatory Visit: Admitting: Internal Medicine
